# Patient Record
Sex: Male | Born: 1985 | Race: Black or African American | Hispanic: No | Smoking: Never smoker
Health system: Southern US, Community
[De-identification: ages and names within clinical notes are randomized; demographics above are authoritative.]

## PROBLEM LIST (undated history)

## (undated) DIAGNOSIS — I10 Essential (primary) hypertension: Secondary | ICD-10-CM

---
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
## 2004-08-25 ENCOUNTER — Emergency Department: Payer: Self-pay | Admitting: General Practice

## 2007-04-06 ENCOUNTER — Emergency Department (HOSPITAL_COMMUNITY): Admission: EM | Admit: 2007-04-06 | Discharge: 2007-04-06 | Payer: Self-pay | Admitting: Emergency Medicine

## 2007-07-13 ENCOUNTER — Emergency Department (HOSPITAL_COMMUNITY): Admission: EM | Admit: 2007-07-13 | Discharge: 2007-07-13 | Payer: Self-pay | Admitting: Emergency Medicine

## 2007-07-17 ENCOUNTER — Emergency Department (HOSPITAL_COMMUNITY): Admission: EM | Admit: 2007-07-17 | Discharge: 2007-07-17 | Payer: Self-pay | Admitting: Emergency Medicine

## 2007-08-14 ENCOUNTER — Emergency Department (HOSPITAL_COMMUNITY): Admission: EM | Admit: 2007-08-14 | Discharge: 2007-08-14 | Payer: Self-pay | Admitting: Family Medicine

## 2007-11-09 ENCOUNTER — Emergency Department (HOSPITAL_COMMUNITY): Admission: EM | Admit: 2007-11-09 | Discharge: 2007-11-09 | Payer: Self-pay | Admitting: Family Medicine

## 2008-06-06 ENCOUNTER — Emergency Department (HOSPITAL_COMMUNITY): Admission: EM | Admit: 2008-06-06 | Discharge: 2008-06-06 | Payer: Self-pay | Admitting: Family Medicine

## 2008-09-27 ENCOUNTER — Emergency Department (HOSPITAL_COMMUNITY): Admission: EM | Admit: 2008-09-27 | Discharge: 2008-09-27 | Payer: Self-pay | Admitting: Emergency Medicine

## 2008-12-25 ENCOUNTER — Emergency Department (HOSPITAL_COMMUNITY): Admission: EM | Admit: 2008-12-25 | Discharge: 2008-12-25 | Payer: Self-pay | Admitting: Family Medicine

## 2009-01-28 ENCOUNTER — Emergency Department (HOSPITAL_COMMUNITY): Admission: EM | Admit: 2009-01-28 | Discharge: 2009-01-28 | Payer: Self-pay | Admitting: Emergency Medicine

## 2009-12-23 ENCOUNTER — Inpatient Hospital Stay (HOSPITAL_COMMUNITY)
Admission: AD | Admit: 2009-12-23 | Discharge: 2009-12-27 | Payer: Self-pay | Source: Home / Self Care | Admitting: Psychiatry

## 2009-12-23 ENCOUNTER — Ambulatory Visit: Payer: Self-pay | Admitting: Psychiatry

## 2009-12-23 ENCOUNTER — Emergency Department (HOSPITAL_COMMUNITY)
Admission: EM | Admit: 2009-12-23 | Discharge: 2009-12-23 | Disposition: A | Discharge status: Other Acute Inpatient Hospital | Payer: Self-pay | Source: Home / Self Care | Admitting: Emergency Medicine

## 2010-04-08 LAB — CBC
HCT: 47.5 % (ref 39.0–52.0)
Hemoglobin: 16.6 g/dL (ref 13.0–17.0)
MCH: 30 pg (ref 26.0–34.0)
MCHC: 34.9 g/dL (ref 30.0–36.0)
MCV: 85.9 fL (ref 78.0–100.0)
RBC: 5.53 MIL/uL (ref 4.22–5.81)

## 2010-04-08 LAB — BASIC METABOLIC PANEL
CO2: 28 mEq/L (ref 19–32)
Chloride: 104 mEq/L (ref 96–112)
GFR calc Af Amer: >60 mL/min (ref >60)
Glucose, Bld: 96 mg/dL (ref 70–99)
Potassium: 3.7 mEq/L (ref 3.5–5.1)
Sodium: 138 mEq/L (ref 135–145)

## 2010-04-08 LAB — DIFFERENTIAL
Basophils Relative: 1 % (ref 0–1)
Eosinophils Absolute: 1.5 10*3/uL — ABNORMAL HIGH (ref 0.0–0.7)
Lymphs Abs: 1.7 10*3/uL (ref 0.7–4.0)
Monocytes Absolute: 0.7 10*3/uL (ref 0.1–1.0)
Monocytes Relative: 9 % (ref 3–12)
Neutrophils Relative %: 49 % (ref 43–77)

## 2010-04-08 LAB — RAPID URINE DRUG SCREEN, HOSP PERFORMED
Amphetamines: NOT DETECTED
Cocaine: NOT DETECTED
Opiates: NOT DETECTED
Tetrahydrocannabinol: NOT DETECTED

## 2010-04-09 LAB — HEPATIC FUNCTION PANEL
ALT: 12 U/L (ref 0–53)
AST: 16 U/L (ref 0–37)
Bilirubin, Direct: 0.2 mg/dL (ref 0.0–0.3)
Indirect Bilirubin: 1.9 mg/dL — ABNORMAL HIGH (ref 0.3–0.9)
Total Protein: 7 g/dL (ref 6.0–8.3)

## 2010-04-30 LAB — GC/CHLAMYDIA PROBE AMP, GENITAL: Chlamydia, DNA Probe: POSITIVE — AB

## 2010-05-06 LAB — GC/CHLAMYDIA PROBE AMP, GENITAL
Chlamydia, DNA Probe: NEGATIVE
GC Probe Amp, Genital: NEGATIVE

## 2010-10-24 LAB — POCT URINALYSIS DIP (DEVICE)
Bilirubin Urine: NEGATIVE
Ketones, ur: NEGATIVE
Operator id: 282151
Specific Gravity, Urine: 1.02

## 2010-10-24 LAB — GC/CHLAMYDIA PROBE AMP, GENITAL: Chlamydia, DNA Probe: NEGATIVE

## 2010-10-27 LAB — GC/CHLAMYDIA PROBE AMP, GENITAL
Chlamydia, DNA Probe: NEGATIVE
GC Probe Amp, Genital: POSITIVE — AB

## 2010-10-27 LAB — RPR: RPR Ser Ql: NONREACTIVE

## 2010-10-27 LAB — HIV ANTIBODY (ROUTINE TESTING W REFLEX): HIV: NONREACTIVE

## 2012-06-22 ENCOUNTER — Emergency Department (INDEPENDENT_AMBULATORY_CARE_PROVIDER_SITE_OTHER)
Admission: EM | Admit: 2012-06-22 | Discharge: 2012-06-22 | Disposition: A | Discharge status: Home / Self Care | Payer: Self-pay | Source: Home / Self Care

## 2012-06-22 ENCOUNTER — Encounter (HOSPITAL_COMMUNITY): Payer: Self-pay | Admitting: Emergency Medicine

## 2012-06-22 ENCOUNTER — Other Ambulatory Visit (HOSPITAL_COMMUNITY)
Admission: RE | Admit: 2012-06-22 | Discharge: 2012-06-22 | Disposition: A | Discharge status: Home / Self Care | Payer: Self-pay | Source: Ambulatory Visit | Attending: Family Medicine | Admitting: Family Medicine

## 2012-06-22 DIAGNOSIS — Z711 Person with feared health complaint in whom no diagnosis is made: Secondary | ICD-10-CM

## 2012-06-22 DIAGNOSIS — R369 Urethral discharge, unspecified: Secondary | ICD-10-CM

## 2012-06-22 DIAGNOSIS — Z113 Encounter for screening for infections with a predominantly sexual mode of transmission: Secondary | ICD-10-CM | POA: Insufficient documentation

## 2012-06-22 LAB — POCT URINALYSIS DIP (DEVICE)
Glucose, UA: NEGATIVE mg/dL
Nitrite: NEGATIVE
Protein, ur: NEGATIVE mg/dL
Urobilinogen, UA: 1 mg/dL (ref 0.0–1.0)

## 2012-06-22 MED ORDER — CEFTRIAXONE SODIUM 250 MG IJ SOLR
INTRAMUSCULAR | Status: AC
  Filled 2012-06-22: qty 250

## 2012-06-22 MED ORDER — AZITHROMYCIN 250 MG PO TABS
ORAL_TABLET | ORAL | Status: AC
  Filled 2012-06-22: qty 4

## 2012-06-22 MED ORDER — CEFTRIAXONE SODIUM 250 MG IJ SOLR
250.0000 mg | Freq: Once | INTRAMUSCULAR | Status: AC
  Administered 2012-06-22: 250 mg via INTRAMUSCULAR

## 2012-06-22 MED ORDER — LIDOCAINE HCL (PF) 1 % IJ SOLN
INTRAMUSCULAR | Status: AC
  Filled 2012-06-22: qty 5

## 2012-06-22 MED ORDER — AZITHROMYCIN 250 MG PO TABS
1000.0000 mg | ORAL_TABLET | Freq: Once | ORAL | Status: AC
  Administered 2012-06-22: 1000 mg via ORAL

## 2012-06-22 NOTE — ED Notes (Signed)
C/o penile yellowish discharge onset this AM.  No dysuria.
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
## 2012-06-24 ENCOUNTER — Telehealth (HOSPITAL_COMMUNITY): Payer: Self-pay | Admitting: *Deleted

## 2012-06-24 LAB — URINE CULTURE: Culture: NO GROWTH

## 2012-06-24 NOTE — ED Notes (Signed)
GC/Chlamydia pos., Urine culture: No growth.  Pt. Adequately treated with Rocephin and Zithromax. I called pt. And mailbox was full. Unable to leave a message. Vassie Moselle 06/24/2012

## 2012-06-25 NOTE — ED Provider Notes (Signed)
Medical screening examination/treatment/procedure(s) were performed by resident physician or non-physician practitioner and as supervising physician I was immediately available for consultation/collaboration.   Gracianna Vink DOUGLAS MD.   Avielle Imbert D Muranda Coye, MD 06/25/12 1100 

## 2012-06-28 NOTE — ED Notes (Signed)
Chart review.

## 2012-06-28 NOTE — ED Notes (Signed)
Unable to contact by phone, letter sent, asking them to call to discuss visit

## 2012-07-07 ENCOUNTER — Telehealth (HOSPITAL_COMMUNITY): Payer: Self-pay | Admitting: *Deleted

## 2012-12-16 ENCOUNTER — Encounter (HOSPITAL_COMMUNITY): Payer: Self-pay | Admitting: Emergency Medicine

## 2012-12-16 ENCOUNTER — Emergency Department (HOSPITAL_COMMUNITY)
Admission: EM | Admit: 2012-12-16 | Discharge: 2012-12-16 | Disposition: A | Discharge status: Home / Self Care | Payer: Self-pay | Attending: Emergency Medicine | Admitting: Emergency Medicine

## 2012-12-16 DIAGNOSIS — S0993XA Unspecified injury of face, initial encounter: Secondary | ICD-10-CM

## 2012-12-16 DIAGNOSIS — S01511A Laceration without foreign body of lip, initial encounter: Secondary | ICD-10-CM

## 2012-12-16 DIAGNOSIS — S01501A Unspecified open wound of lip, initial encounter: Secondary | ICD-10-CM | POA: Insufficient documentation

## 2012-12-16 DIAGNOSIS — S025XXA Fracture of tooth (traumatic), initial encounter for closed fracture: Secondary | ICD-10-CM | POA: Insufficient documentation

## 2012-12-16 MED ORDER — TRAMADOL HCL 50 MG PO TABS
50.0000 mg | ORAL_TABLET | Freq: Once | ORAL | Status: AC
  Administered 2012-12-16: 50 mg via ORAL
  Filled 2012-12-16: qty 1

## 2012-12-16 MED ORDER — TRAMADOL HCL 50 MG PO TABS: 50.0000 mg | ORAL_TABLET | Freq: Four times a day (QID) | ORAL | Status: DC | PRN
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
## 2012-12-16 NOTE — ED Provider Notes (Signed)
Medical screening examination/treatment/procedure(s) were performed by non-physician practitioner and as supervising physician I was immediately available for consultation/collaboration.   Ean Gettel, MD 12/16/12 0606 
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
## 2012-12-18 ENCOUNTER — Encounter (HOSPITAL_COMMUNITY): Payer: Self-pay | Admitting: Emergency Medicine

## 2012-12-18 ENCOUNTER — Inpatient Hospital Stay (HOSPITAL_COMMUNITY)
Admission: EM | Admit: 2012-12-18 | Discharge: 2012-12-20 | DRG: 603 | Disposition: A | Discharge status: Home / Self Care | Payer: Self-pay | Attending: Internal Medicine | Admitting: Internal Medicine

## 2012-12-18 ENCOUNTER — Emergency Department (HOSPITAL_COMMUNITY): Payer: Self-pay

## 2012-12-18 DIAGNOSIS — L0201 Cutaneous abscess of face: Principal | ICD-10-CM | POA: Diagnosis present

## 2012-12-18 DIAGNOSIS — IMO0001 Reserved for inherently not codable concepts without codable children: Secondary | ICD-10-CM

## 2012-12-18 DIAGNOSIS — S02401A Maxillary fracture, unspecified, initial encounter for closed fracture: Secondary | ICD-10-CM

## 2012-12-18 DIAGNOSIS — L03211 Cellulitis of face: Principal | ICD-10-CM

## 2012-12-18 DIAGNOSIS — S02400A Malar fracture unspecified, initial encounter for closed fracture: Secondary | ICD-10-CM

## 2012-12-18 DIAGNOSIS — K0889 Other specified disorders of teeth and supporting structures: Secondary | ICD-10-CM | POA: Diagnosis present

## 2012-12-18 DIAGNOSIS — S02401D Maxillary fracture, unspecified, subsequent encounter for fracture with routine healing: Secondary | ICD-10-CM

## 2012-12-18 LAB — CBC WITH DIFFERENTIAL/PLATELET
Basophils Absolute: 0 10*3/uL (ref 0.0–0.1)
Basophils Relative: 0 % (ref 0–1)
Eosinophils Absolute: 0 10*3/uL (ref 0.0–0.7)
Eosinophils Relative: 1 % (ref 0–5)
HCT: 47.3 % (ref 39.0–52.0)
Hemoglobin: 16.7 g/dL (ref 13.0–17.0)
Lymphocytes Relative: 10 % — ABNORMAL LOW (ref 12–46)
Lymphs Abs: 0.7 10*3/uL (ref 0.7–4.0)
MCH: 29.7 pg (ref 26.0–34.0)
MCHC: 35.3 g/dL (ref 30.0–36.0)
MCV: 84 fL (ref 78.0–100.0)
Monocytes Absolute: 0.6 10*3/uL (ref 0.1–1.0)
Monocytes Relative: 9 % (ref 3–12)
Neutro Abs: 5.3 10*3/uL (ref 1.7–7.7)
Neutrophils Relative %: 80 % — ABNORMAL HIGH (ref 43–77)
Platelets: 161 10*3/uL (ref 150–400)
RBC: 5.63 MIL/uL (ref 4.22–5.81)
RDW: 12.9 % (ref 11.5–15.5)
WBC: 6.6 10*3/uL (ref 4.0–10.5)

## 2012-12-18 LAB — BASIC METABOLIC PANEL
BUN: 7 mg/dL (ref 6–23)
CO2: 26 mEq/L (ref 19–32)
Calcium: 9.1 mg/dL (ref 8.4–10.5)
Chloride: 101 mEq/L (ref 96–112)
Creatinine, Ser: 0.84 mg/dL (ref 0.50–1.35)
GFR calc Af Amer: >90 mL/min (ref >90)
GFR calc non Af Amer: >90 mL/min (ref >90)
Glucose, Bld: 100 mg/dL — ABNORMAL HIGH (ref 70–99)
Potassium: 3.6 mEq/L (ref 3.5–5.1)
Sodium: 138 mEq/L (ref 135–145)

## 2012-12-18 LAB — POCT I-STAT CREATININE: Creatinine, Ser: 1.1 mg/dL (ref 0.50–1.35)

## 2012-12-18 MED ORDER — ONDANSETRON 4 MG PO TBDP: 4.0000 mg | ORAL_TABLET | Freq: Once | ORAL | Status: DC

## 2012-12-18 MED ORDER — HYDROMORPHONE HCL PF 1 MG/ML IJ SOLN
1.0000 mg | Freq: Once | INTRAMUSCULAR | Status: AC
  Administered 2012-12-18: 1 mg via INTRAVENOUS
  Filled 2012-12-18: qty 1

## 2012-12-18 MED ORDER — ONDANSETRON HCL 4 MG/2ML IJ SOLN
4.0000 mg | Freq: Once | INTRAMUSCULAR | Status: AC
  Administered 2012-12-18: 4 mg via INTRAVENOUS
  Filled 2012-12-18: qty 2

## 2012-12-18 MED ORDER — ACETAMINOPHEN 325 MG PO TABS: 650.0000 mg | ORAL_TABLET | Freq: Four times a day (QID) | ORAL | Status: DC | PRN

## 2012-12-18 MED ORDER — CLINDAMYCIN PHOSPHATE 600 MG/50ML IV SOLN
600.0000 mg | Freq: Once | INTRAVENOUS | Status: AC
  Administered 2012-12-18: 600 mg via INTRAVENOUS
  Filled 2012-12-18: qty 50

## 2012-12-18 MED ORDER — IOHEXOL 300 MG/ML  SOLN
80.0000 mL | Freq: Once | INTRAMUSCULAR | Status: AC | PRN
  Administered 2012-12-18: 80 mL via INTRAVENOUS

## 2012-12-18 MED ORDER — VANCOMYCIN HCL IN DEXTROSE 1-5 GM/200ML-% IV SOLN
1000.0000 mg | Freq: Three times a day (TID) | INTRAVENOUS | Status: DC
  Administered 2012-12-18 – 2012-12-20 (×6): 1000 mg via INTRAVENOUS
  Filled 2012-12-18 (×10): qty 200

## 2012-12-18 MED ORDER — SODIUM CHLORIDE 0.9 % IV SOLN
INTRAVENOUS | Status: DC
  Administered 2012-12-19: via INTRAVENOUS

## 2012-12-18 MED ORDER — AMPICILLIN-SULBACTAM SODIUM 3 (2-1) G IJ SOLR
3.0000 g | Freq: Four times a day (QID) | INTRAMUSCULAR | Status: DC
  Administered 2012-12-18 – 2012-12-20 (×8): 3 g via INTRAVENOUS
  Filled 2012-12-18 (×12): qty 3

## 2012-12-18 MED ORDER — HYDROMORPHONE HCL PF 1 MG/ML IJ SOLN
1.0000 mg | INTRAMUSCULAR | Status: DC | PRN
  Administered 2012-12-19 – 2012-12-20 (×4): 1 mg via INTRAVENOUS
  Filled 2012-12-18 (×4): qty 1

## 2012-12-18 MED ORDER — SODIUM CHLORIDE 0.9 % IV BOLUS (SEPSIS)
1000.0000 mL | Freq: Once | INTRAVENOUS | Status: AC
  Administered 2012-12-18: 1000 mL via INTRAVENOUS

## 2012-12-18 MED ORDER — ONDANSETRON HCL 4 MG/2ML IJ SOLN
4.0000 mg | Freq: Four times a day (QID) | INTRAMUSCULAR | Status: DC | PRN
  Administered 2012-12-19 – 2012-12-20 (×3): 4 mg via INTRAVENOUS
  Filled 2012-12-18 (×3): qty 2

## 2012-12-18 MED ORDER — KETOROLAC TROMETHAMINE 30 MG/ML IJ SOLN
30.0000 mg | Freq: Four times a day (QID) | INTRAMUSCULAR | Status: AC | PRN
  Administered 2012-12-18 – 2012-12-19 (×2): 30 mg via INTRAVENOUS
  Filled 2012-12-18 (×3): qty 1
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
## 2012-12-18 NOTE — ED Notes (Signed)
Joni Reining, PA back at the bedside with patient.
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
## 2012-12-18 NOTE — ED Provider Notes (Signed)
Medical screening examination/treatment/procedure(s) were performed by non-physician practitioner and as supervising physician I was immediately available for consultation/collaboration.  EKG Interpretation    Date/Time:    Ventricular Rate:    PR Interval:    QRS Duration:   QT Interval:    QTC Calculation:   R Axis:     Text Interpretation:               Shon Baton, MD 12/18/12 2038

## 2012-12-18 NOTE — ED Notes (Signed)
Per pt was seen at [facility] yesterday and had a laceration sutured and now whole right side of face is swollen and very painful

## 2012-12-18 NOTE — ED Notes (Signed)
Nicole, PA at the bedside.  

## 2012-12-19 LAB — BASIC METABOLIC PANEL
CO2: 27 mEq/L (ref 19–32)
Calcium: 8.7 mg/dL (ref 8.4–10.5)
Creatinine, Ser: 1.02 mg/dL (ref 0.50–1.35)
GFR calc Af Amer: >90 mL/min (ref >90)
GFR calc non Af Amer: >90 mL/min (ref >90)

## 2012-12-19 LAB — CBC
HCT: 42.1 % (ref 39.0–52.0)
MCH: 28.6 pg (ref 26.0–34.0)
MCHC: 33.7 g/dL (ref 30.0–36.0)
MCV: 84.7 fL (ref 78.0–100.0)
Platelets: 162 10*3/uL (ref 150–400)
RBC: 4.97 MIL/uL (ref 4.22–5.81)
RDW: 12.9 % (ref 11.5–15.5)

## 2012-12-19 MED ORDER — KETOROLAC TROMETHAMINE 30 MG/ML IJ SOLN
30.0000 mg | Freq: Four times a day (QID) | INTRAMUSCULAR | Status: DC | PRN
  Administered 2012-12-19 – 2012-12-20 (×3): 30 mg via INTRAVENOUS
  Filled 2012-12-19 (×2): qty 1

## 2012-12-19 NOTE — Progress Notes (Signed)
12/19/12 Spoke with patient about PCP, he agreed to go to [facility]. Made appt for 01/04/13 at 12pm. Gave patient appt time and a pharmacy discount card. Will continue to follow for d/c needs. Jacquelynn Cree RN, BSN, CCM
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
## 2012-12-20 DIAGNOSIS — IMO0001 Reserved for inherently not codable concepts without codable children: Secondary | ICD-10-CM

## 2012-12-20 MED ORDER — BACITRACIN 500 UNIT/GM EX OINT: 1.0000 "application " | TOPICAL_OINTMENT | Freq: Two times a day (BID) | CUTANEOUS | Status: DC

## 2012-12-20 MED ORDER — OXYCODONE-ACETAMINOPHEN 5-325 MG PO TABS: 1.0000 | ORAL_TABLET | ORAL | Status: DC | PRN

## 2012-12-20 MED ORDER — AMOXICILLIN-POT CLAVULANATE 875-125 MG PO TABS: 1.0000 | ORAL_TABLET | Freq: Two times a day (BID) | ORAL | Status: DC

## 2012-12-20 NOTE — Progress Notes (Signed)
Dr Pollyann Kennedy called re: patient states a stitch came out while getting dressed in room. Dabbing blood from inside upper lip wound. Gauze to bleeding. No further orders. Reminded patient he needs to see dentis=st ASA[facility] per Dr Pollyann Kennedy. D/C in stable condition and bleeding then stopped.
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
## 2013-01-04 ENCOUNTER — Inpatient Hospital Stay: Payer: Self-pay

## 2013-01-04 ENCOUNTER — Telehealth: Payer: Self-pay | Admitting: Internal Medicine

## 2013-01-04 NOTE — Telephone Encounter (Signed)
Pt is calling to ask if she needs to come in to get her stiches removed; pt did not make it to the appt because she had a prior appt. With the neurologist;

## 2013-02-13 ENCOUNTER — Other Ambulatory Visit (HOSPITAL_COMMUNITY)
Admission: RE | Admit: 2013-02-13 | Discharge: 2013-02-13 | Disposition: A | Discharge status: Home / Self Care | Payer: Self-pay | Source: Ambulatory Visit | Attending: Family Medicine | Admitting: Family Medicine

## 2013-02-13 ENCOUNTER — Encounter (HOSPITAL_COMMUNITY): Payer: Self-pay | Admitting: Emergency Medicine

## 2013-02-13 ENCOUNTER — Emergency Department (INDEPENDENT_AMBULATORY_CARE_PROVIDER_SITE_OTHER)
Admission: EM | Admit: 2013-02-13 | Discharge: 2013-02-13 | Disposition: A | Discharge status: Home / Self Care | Payer: Self-pay | Source: Home / Self Care | Attending: Family Medicine | Admitting: Family Medicine

## 2013-02-13 DIAGNOSIS — Z113 Encounter for screening for infections with a predominantly sexual mode of transmission: Secondary | ICD-10-CM | POA: Insufficient documentation

## 2013-02-13 DIAGNOSIS — Z202 Contact with and (suspected) exposure to infections with a predominantly sexual mode of transmission: Secondary | ICD-10-CM

## 2013-02-13 MED ORDER — CEFTRIAXONE SODIUM 250 MG IJ SOLR
250.0000 mg | Freq: Once | INTRAMUSCULAR | Status: AC
  Administered 2013-02-13: 250 mg via INTRAMUSCULAR

## 2013-02-13 MED ORDER — AZITHROMYCIN 250 MG PO TABS
ORAL_TABLET | ORAL | Status: AC
  Filled 2013-02-13: qty 4

## 2013-02-13 MED ORDER — AZITHROMYCIN 250 MG PO TABS
1000.0000 mg | ORAL_TABLET | Freq: Once | ORAL | Status: AC
  Administered 2013-02-13: 1000 mg via ORAL

## 2013-02-13 MED ORDER — CEFTRIAXONE SODIUM 250 MG IJ SOLR
INTRAMUSCULAR | Status: AC
  Filled 2013-02-13: qty 250

## 2013-02-13 MED ORDER — LIDOCAINE HCL (PF) 1 % IJ SOLN
INTRAMUSCULAR | Status: AC
  Filled 2013-02-13: qty 5

## 2013-02-13 NOTE — Discharge Instructions (Signed)
Sexually Transmitted Disease A sexually transmitted disease (STD) is a disease or infection. It may be passed from person to person. It usually is passed during sex. STDs can be spread by different types of germs. These germs are bacteria, viruses, and parasites. An STD can be passed through:  Spit (saliva).  Semen.  Blood.  Mucus from the vagina.  Pee (urine). HOW CAN I LESSEN MY CHANCES OF GETTING AN STD?  Only use condoms labeled "latex," dental dams, and lubricants that wash away with water (water soluble). Do not use petroleum jelly or oils.  Get shots (vaccines) for HPV and hepatitis.  Avoid risky sex behavior that can break the skin. WHAT SHOULD I DO IF I THINK I HAVE AN STD?  See your doctor.  Tell your sex partner(s) that you have an STD. They should be tested and treated.  Do not have sex until your doctor says it is OK. WHEN SHOULD I GET HELP? Get help if:  You have bad belly (abdominal) pain.  You are a man and have puffiness (swelling) or pain in your testicles.  You are a woman and have puffiness in your vagina. MAKE SURE YOU:  Understand these instructions. Document Released: 02/20/2004 Document Revised: 11/02/2012 Document Reviewed: 07/08/2012 ExitCare Patient Information 2014 [employer].  

## 2013-02-13 NOTE — ED Provider Notes (Signed)
CSN: [id number]     Arrival date & time 02/13/13  1148 History   First MD Initiated Contact with Patient 02/13/13 1329     Chief Complaint  Patient presents with  . Exposure to STD   (Consider location/radiation/quality/duration/timing/severity/associated sxs/prior Treatment) HPI Comments: Partner reported to pt she was positive for chlamydia. Requests testing and treatment  Patient is a 28 y.o. male presenting with STD exposure. The history is provided by the patient.  Exposure to STD This is a new problem. Episode onset: unknown. The problem has not changed since onset.Pertinent negatives include no abdominal pain. Nothing aggravates the symptoms. Nothing relieves the symptoms. He has tried nothing for the symptoms.    History reviewed. No pertinent past medical history. History reviewed. No pertinent past surgical history. History reviewed. No pertinent family history. History  Substance Use Topics  . Smoking status: Never Smoker   . Smokeless tobacco: Not on file  . Alcohol Use: 1.2 oz/week    2 Cans of beer per week    Review of Systems  Constitutional: Negative for fever and chills.  Gastrointestinal: Negative for abdominal pain.  Genitourinary: Negative for dysuria, discharge, genital sores, penile pain and testicular pain.    Allergies  Review of patient's allergies indicates no known allergies.  Home Medications   Current Outpatient Rx  Name  Route  Sig  Dispense  Refill  . amoxicillin-clavulanate (AUGMENTIN) 875-125 MG per tablet   Oral   Take 1 tablet by mouth 2 (two) times daily.   20 tablet   0   . bacitracin 500 UNIT/GM ointment   Topical   Apply 1 application topically 2 (two) times daily.   15 g   2   . oxyCODONE-acetaminophen (ROXICET) 5-325 MG per tablet   Oral   Take 1 tablet by mouth every 4 (four) hours as needed for severe pain.   30 tablet   0    BP 150/90  Pulse 80  Temp(Src) 98.6 F (37 C) (Oral)  Resp 16  SpO2 100% Physical  Exam  Constitutional: He appears well-developed and well-nourished. No distress.  Pulmonary/Chest: Effort normal.  Genitourinary:  deferred    ED Course  Procedures (including critical care time) Labs Review Labs Reviewed  URINE CYTOLOGY ANCILLARY ONLY   Imaging Review No results found.  EKG Interpretation    Date/Time:    Ventricular Rate:    PR Interval:    QRS Duration:   QT Interval:    QTC Calculation:   R Axis:     Text Interpretation:              MDM   1. Exposure to STD   given rocephin 250mg  IM and chlamydia 1000mg  [facility].      Cathlyn ParsonsAngela M Eadie Repetto, NP 02/13/13 [phone]

## 2013-02-13 NOTE — ED Notes (Signed)
Reports being told today by partner that she tested positive for ct.   Pt denies having any symptoms at this time.  Here for testing.

## 2013-02-14 NOTE — ED Provider Notes (Signed)
Medical screening examination/treatment/procedure(s) were performed by resident physician or non-physician practitioner and as supervising physician I was immediately available for consultation/collaboration.   Ladonya Jerkins DOUGLAS MD.   Broderick Fonseca D Saifullah Jolley, MD 02/14/13 1407 

## 2013-02-15 ENCOUNTER — Telehealth (HOSPITAL_COMMUNITY): Payer: Self-pay | Admitting: *Deleted
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
## 2013-06-26 ENCOUNTER — Encounter (HOSPITAL_COMMUNITY): Payer: Self-pay | Admitting: Emergency Medicine

## 2013-06-26 ENCOUNTER — Emergency Department (HOSPITAL_COMMUNITY)
Admission: EM | Admit: 2013-06-26 | Discharge: 2013-06-26 | Disposition: A | Discharge status: Home / Self Care | Payer: Self-pay | Attending: Emergency Medicine | Admitting: Emergency Medicine

## 2013-06-26 ENCOUNTER — Emergency Department (HOSPITAL_COMMUNITY): Payer: Self-pay

## 2013-06-26 DIAGNOSIS — S339XXA Sprain of unspecified parts of lumbar spine and pelvis, initial encounter: Secondary | ICD-10-CM | POA: Insufficient documentation

## 2013-06-26 DIAGNOSIS — Y998 Other external cause status: Secondary | ICD-10-CM | POA: Insufficient documentation

## 2013-06-26 DIAGNOSIS — S335XXA Sprain of ligaments of lumbar spine, initial encounter: Principal | ICD-10-CM

## 2013-06-26 DIAGNOSIS — S39012A Strain of muscle, fascia and tendon of lower back, initial encounter: Secondary | ICD-10-CM

## 2013-06-26 DIAGNOSIS — Y9389 Activity, other specified: Secondary | ICD-10-CM | POA: Insufficient documentation

## 2013-06-26 DIAGNOSIS — Y929 Unspecified place or not applicable: Secondary | ICD-10-CM | POA: Insufficient documentation

## 2013-06-26 DIAGNOSIS — W208XXA Other cause of strike by thrown, projected or falling object, initial encounter: Secondary | ICD-10-CM | POA: Insufficient documentation

## 2013-06-26 DIAGNOSIS — I10 Essential (primary) hypertension: Secondary | ICD-10-CM | POA: Insufficient documentation

## 2013-06-26 HISTORY — DX: Essential (primary) hypertension: I10

## 2013-06-26 MED ORDER — KETOROLAC TROMETHAMINE 60 MG/2ML IM SOLN
60.0000 mg | Freq: Once | INTRAMUSCULAR | Status: AC
  Administered 2013-06-26: 60 mg via INTRAMUSCULAR
  Filled 2013-06-26: qty 2

## 2013-06-26 MED ORDER — DIAZEPAM 5 MG PO TABS
5.0000 mg | ORAL_TABLET | Freq: Once | ORAL | Status: AC
  Administered 2013-06-26: 5 mg via ORAL
  Filled 2013-06-26: qty 1

## 2013-06-26 MED ORDER — DIAZEPAM 5 MG PO TABS: 5.0000 mg | ORAL_TABLET | Freq: Four times a day (QID) | ORAL | Status: DC | PRN

## 2013-06-26 MED ORDER — IBUPROFEN 600 MG PO TABS: 600.0000 mg | ORAL_TABLET | Freq: Four times a day (QID) | ORAL | Status: DC | PRN

## 2013-06-26 NOTE — Discharge Instructions (Signed)
Lumbosacral Strain Lumbosacral strain is a strain of any of the parts that make up your lumbosacral vertebrae. Your lumbosacral vertebrae are the bones that make up the lower third of your backbone. Your lumbosacral vertebrae are held together by muscles and tough, fibrous tissue (ligaments).  CAUSES  A sudden blow to your back can cause lumbosacral strain. Also, anything that causes an excessive stretch of the muscles in the low back can cause this strain. This is typically seen when people exert themselves strenuously, fall, lift heavy objects, bend, or crouch repeatedly. RISK FACTORS  Physically demanding work.  Participation in pushing or pulling sports or sports that require sudden twist of the back (tennis, golf, baseball).  Weight lifting.  Excessive lower back curvature.  Forward-tilted pelvis.  Weak back or abdominal muscles or both.  Tight hamstrings. SIGNS AND SYMPTOMS  Lumbosacral strain may cause pain in the area of your injury or pain that moves (radiates) down your leg.  DIAGNOSIS Your health care provider can often diagnose lumbosacral strain through a physical exam. In some cases, you may need tests such as X-ray exams.  TREATMENT  Treatment for your lower back injury depends on many factors that your clinician will have to evaluate. However, most treatment will include the use of anti-inflammatory medicines. HOME CARE INSTRUCTIONS   Avoid hard physical activities (tennis, racquetball, waterskiing) if you are not in proper physical condition for it. This may aggravate or create problems.  If you have a back problem, avoid sports requiring sudden body movements. Swimming and walking are generally safer activities.  Maintain good posture.  Maintain a healthy weight.  For acute conditions, you may put ice on the injured area.  Put ice in a plastic bag.  Place a towel between your skin and the bag.  Leave the ice on for 20 minutes, 2 3 times a day.  When the  low back starts healing, stretching and strengthening exercises may be recommended. SEEK MEDICAL CARE IF:  Your back pain is getting worse.  You experience severe back pain not relieved with medicines. SEEK IMMEDIATE MEDICAL CARE IF:   You have numbness, tingling, weakness, or problems with the use of your arms or legs.  There is a change in bowel or bladder control.  You have increasing pain in any area of the body, including your belly (abdomen).  You notice shortness of breath, dizziness, or feel faint.  You feel sick to your stomach (nauseous), are throwing up (vomiting), or become sweaty.  You notice discoloration of your toes or legs, or your feet get very cold. MAKE SURE YOU:   Understand these instructions.  Will watch your condition.  Will get help right away if you are not doing well or get worse. Document Released: 10/22/2004 Document Revised: 11/02/2012 Document Reviewed: 08/31/2012 ExitCare Patient Information 2014 [employer].  

## 2013-06-26 NOTE — ED Provider Notes (Signed)
CSN: [id number]     Arrival date & time 06/26/13  [id number] History   First MD Initiated Contact with Patient 06/26/13 [phone]     Chief Complaint  Patient presents with  . Back Pain     (Consider location/radiation/quality/duration/timing/severity/associated sxs/prior Treatment) HPI  This is a 28 year old male with history of hypertension who presents with back pain. Patient reports that on Friday he was fixing a slat under the bed when the box brings fell on his back.  He reports pain to the left back. It has been progressively worse since Friday. He denies any difficulty ambulating,. The pain radiates down his left leg. Currently pain is 8/10. It does not get better with Advil.  He denies any difficulty peeing or having bowel movements, saddle anesthesia, or recent fevers. He has not noted any hematuria.  Past Medical History  Diagnosis Date  . Hypertension    History reviewed. No pertinent past surgical history. No family history on file. History  Substance Use Topics  . Smoking status: Never Smoker   . Smokeless tobacco: Not on file  . Alcohol Use: 1.2 oz/week    2 Cans of beer per week    Review of Systems  Constitutional: Negative.  Negative for fever.  Respiratory: Negative.  Negative for shortness of breath.   Cardiovascular: Negative.  Negative for chest pain.  Gastrointestinal: Negative.  Negative for abdominal pain.  Genitourinary: Negative.  Negative for hematuria.  Musculoskeletal: Positive for back pain.  Skin: Negative for wound.  Neurological: Negative for weakness, numbness and headaches.  All other systems reviewed and are negative.     Allergies  Review of patient's allergies indicates no known allergies.  Home Medications   Prior to Admission medications   Medication Sig Start Date End Date Taking? Authorizing Provider  diazepam (VALIUM) 5 MG tablet Take 1 tablet (5 mg total) by mouth every 6 (six) hours as needed for muscle spasms. 06/26/13   Shon Batonourtney F  Mykhia Danish, MD  ibuprofen (ADVIL,MOTRIN) 600 MG tablet Take 1 tablet (600 mg total) by mouth every 6 (six) hours as needed. 06/26/13   Shon Batonourtney F Rhandi Despain, MD   BP 119/76  Pulse 70  Temp(Src) 98.1 F (36.7 C) (Oral)  Resp 18  SpO2 100% Physical Exam  Nursing note and vitals reviewed. Constitutional: He is oriented to person, place, and time. He appears well-developed and well-nourished. No distress.  HENT:  Head: Normocephalic and atraumatic.  Cardiovascular: Normal rate, regular rhythm and normal heart sounds.   No murmur heard. Pulmonary/Chest: Effort normal and breath sounds normal. No respiratory distress. He has no wheezes.  Abdominal: Soft. There is no tenderness.  Musculoskeletal: He exhibits no edema.  No midline lumbar thoracic spine tenderness, patient does have tenderness palpation over the left lower paraspinous muscle region with associated spasm  Lymphadenopathy:    He has no cervical adenopathy.  Neurological: He is alert and oriented to person, place, and time.  Normal gait, 5 out of 5 strength in all 4 extremities, no clonus noted  Skin: Skin is warm and dry.  Psychiatric: He has a normal mood and affect.    ED Course  Procedures (including critical care time) Labs Review Labs Reviewed  URINALYSIS, ROUTINE W REFLEX MICROSCOPIC    Imaging Review Dg Lumbar Spine Complete  06/26/2013   CLINICAL DATA:  Fall with left low back pain going into left leg.  EXAM: LUMBAR SPINE - COMPLETE 4+ VIEW  COMPARISON:  01/28/2009.  FINDINGS: Alignment is anatomic. Vertebral body  and disc space height are maintained. No pars defects. No degenerative changes.  IMPRESSION: Negative.   Electronically Signed   By: Leanna Battles M.D.   On: 06/26/2013 07:27     EKG Interpretation None      MDM   Final diagnoses:  Lumbosacral strain    Patient presents with right lower back pain. Injury on Friday. He is nontoxic and nonfocal. He has good sensation and strength of the lower  extremities. No other red flags back pain. Lumbar spine films are negative. Muscle spasm noted on exam.  Patient given Valium and Toradol. Likely musculoskeletal sprain. Discussed diagnoses with the patient. Will discharge with valium and ibuprofen.  After history, exam, and medical workup I feel the patient has been appropriately medically screened and is safe for discharge home. Pertinent diagnoses were discussed with the patient. Patient was given return precautions.     Shon Baton, MD 06/26/13 [phone]

## 2013-06-26 NOTE — ED Notes (Signed)
MD at bedside. 

## 2013-06-26 NOTE — ED Notes (Signed)
Patient says he was fixing the bed and placed the box spring against the wall.  He thought it would stay propped up but when he bent over to fix the bed, the box spring fell on him.

## 2013-06-26 NOTE — ED Notes (Signed)
The pt has lower back pain  A bed fell onto his back on friday

## 2013-11-15 ENCOUNTER — Encounter (HOSPITAL_COMMUNITY): Payer: Self-pay | Admitting: Emergency Medicine

## 2013-11-15 ENCOUNTER — Emergency Department (HOSPITAL_COMMUNITY)
Admission: EM | Admit: 2013-11-15 | Discharge: 2013-11-15 | Disposition: A | Discharge status: Home / Self Care | Payer: Self-pay | Attending: Emergency Medicine | Admitting: Emergency Medicine

## 2013-11-15 DIAGNOSIS — L0231 Cutaneous abscess of buttock: Secondary | ICD-10-CM | POA: Insufficient documentation

## 2013-11-15 DIAGNOSIS — I10 Essential (primary) hypertension: Secondary | ICD-10-CM | POA: Insufficient documentation

## 2013-11-15 MED ORDER — HYDROCODONE-ACETAMINOPHEN 5-325 MG PO TABS: 1.0000 | ORAL_TABLET | ORAL | Status: DC | PRN

## 2013-11-15 MED ORDER — SULFAMETHOXAZOLE-TRIMETHOPRIM 400-80 MG PO TABS
1.0000 | ORAL_TABLET | Freq: Two times a day (BID) | ORAL | Status: DC
Start: 2013-11-15 — End: 2014-05-22

## 2013-11-15 NOTE — ED Notes (Signed)
Pt complains of a boil on his buttocks for one week

## 2013-11-15 NOTE — Discharge Instructions (Signed)

## 2013-11-15 NOTE — ED Notes (Signed)
Bed: WA08 Expected date:  Expected time:  Means of arrival:  Comments: 

## 2013-11-15 NOTE — ED Notes (Signed)
Pt c/o boil at top of buttocks; no drainage; symptoms started x 2 days; previous history of same

## 2013-11-15 NOTE — ED Provider Notes (Signed)
CSN: [id number]     Arrival date & time 11/15/13  0705 History   First MD Initiated Contact with Patient 11/15/13 0754     Chief Complaint  Patient presents with  . Recurrent Skin Infections     (Consider location/radiation/quality/duration/timing/severity/associated sxs/prior Treatment) Patient is a 28 y.o. male presenting with abscess. The history is provided by the patient. No language interpreter was used.  Abscess Location:  Ano-genital Ano-genital abscess location:  Gluteal cleft Abscess quality: not draining and no fluctuance   Red streaking: no   Duration:  1 week Chronicity:  New Relieved by:  Nothing Associated symptoms: no fever and no vomiting   Associated symptoms comment:  He reports history of skin infections/boils now with similar symptoms at the right upper gluteal cleft. No drainage, fever, redness, abdominal or perirectal pain.   Past Medical History  Diagnosis Date  . Hypertension    History reviewed. No pertinent past surgical history. History reviewed. No pertinent family history. History  Substance Use Topics  . Smoking status: Never Smoker   . Smokeless tobacco: Not on file  . Alcohol Use: 1.2 oz/week    2 Cans of beer per week    Review of Systems  Constitutional: Negative for fever.  Gastrointestinal: Negative for vomiting and abdominal pain.  Musculoskeletal: Negative for myalgias.  Skin:       See HPI.      Allergies  Review of patient's allergies indicates no known allergies.  Home Medications   Prior to Admission medications   Not on File   BP 142/88  Pulse 87  Temp(Src) 98.1 F (36.7 C) (Oral)  Resp 18  Ht 5\' 6"  (1.676 m)  Wt 160 lb (72.576 kg)  BMI 25.84 kg/m2  SpO2 97% Physical Exam  Constitutional: He is oriented to person, place, and time. He appears well-developed and well-nourished.  Neck: Normal range of motion.  Pulmonary/Chest: Effort normal.  Musculoskeletal: Normal range of motion.  Neurological: He is  alert and oriented to person, place, and time.  Skin: Skin is warm and dry.  Right gluteal cleft tenderness without induration or fluctuant area. No perirectal tenderness. No redness.   Psychiatric: He has a normal mood and affect.    ED Course  Procedures (including critical care time) Labs Review Labs Reviewed - No data to display  Imaging Review No results found.   EKG Interpretation None      MDM   Final diagnoses:  None    1. Early gluteal abscess  Given patient's history of abscess and current tenderness, suspect early abscess that is not drainable at this time. Will start on abx, pain control and strongly encourage 2-day recheck here (no PCP)    Arnoldo HookerShari A Elfreida Heggs, PA-C 11/15/13 [phone]

## 2013-11-16 NOTE — ED Provider Notes (Signed)
Medical screening examination/treatment/procedure(s) were performed by non-physician practitioner and as supervising physician I was immediately available for consultation/collaboration.   EKG Interpretation None       Jayvier Burgher, MD 11/16/13 0755 

## 2013-11-18 ENCOUNTER — Emergency Department (HOSPITAL_COMMUNITY): Payer: Self-pay

## 2013-11-18 ENCOUNTER — Emergency Department (HOSPITAL_COMMUNITY)
Admission: EM | Admit: 2013-11-18 | Discharge: 2013-11-18 | Disposition: A | Discharge status: Home / Self Care | Payer: Self-pay | Attending: Emergency Medicine | Admitting: Emergency Medicine

## 2013-11-18 ENCOUNTER — Encounter (HOSPITAL_COMMUNITY): Payer: Self-pay | Admitting: Emergency Medicine

## 2013-11-18 DIAGNOSIS — Z792 Long term (current) use of antibiotics: Secondary | ICD-10-CM | POA: Insufficient documentation

## 2013-11-18 DIAGNOSIS — I10 Essential (primary) hypertension: Secondary | ICD-10-CM | POA: Insufficient documentation

## 2013-11-18 DIAGNOSIS — K611 Rectal abscess: Secondary | ICD-10-CM | POA: Insufficient documentation

## 2013-11-18 LAB — CBC WITH DIFFERENTIAL/PLATELET
BASOS PCT: 0 % (ref 0–1)
Basophils Absolute: 0 10*3/uL (ref 0.0–0.1)
EOS ABS: 0.3 10*3/uL (ref 0.0–0.7)
Eosinophils Relative: 2 % (ref 0–5)
HEMATOCRIT: 49.2 % (ref 39.0–52.0)
HEMOGLOBIN: 17.3 g/dL — AB (ref 13.0–17.0)
LYMPHS ABS: 1.5 10*3/uL (ref 0.7–4.0)
Lymphocytes Relative: 11 % — ABNORMAL LOW (ref 12–46)
MCH: 30.4 pg (ref 26.0–34.0)
MCHC: 35.2 g/dL (ref 30.0–36.0)
MCV: 86.3 fL (ref 78.0–100.0)
MONO ABS: 1.2 10*3/uL — AB (ref 0.1–1.0)
Monocytes Relative: 9 % (ref 3–12)
Neutro Abs: 10.8 10*3/uL — ABNORMAL HIGH (ref 1.7–7.7)
Neutrophils Relative %: 78 % — ABNORMAL HIGH (ref 43–77)
Platelets: 200 10*3/uL (ref 150–400)
RBC: 5.7 MIL/uL (ref 4.22–5.81)
RDW: 13.7 % (ref 11.5–15.5)
WBC: 13.8 10*3/uL — ABNORMAL HIGH (ref 4.0–10.5)

## 2013-11-18 LAB — I-STAT CHEM 8, ED
BUN: 11 mg/dL (ref 6–23)
CALCIUM ION: 1.17 mmol/L (ref 1.12–1.23)
CREATININE: 1.1 mg/dL (ref 0.50–1.35)
Chloride: 103 mEq/L (ref 96–112)
Glucose, Bld: 87 mg/dL (ref 70–99)
HEMATOCRIT: 54 % — AB (ref 39.0–52.0)
Hemoglobin: 18.4 g/dL — ABNORMAL HIGH (ref 13.0–17.0)
Potassium: 4.1 mEq/L (ref 3.7–5.3)
Sodium: 140 mEq/L (ref 137–147)
TCO2: 26 mmol/L (ref 0–100)

## 2013-11-18 MED ORDER — CLINDAMYCIN HCL 150 MG PO CAPS: 300.0000 mg | ORAL_CAPSULE | Freq: Three times a day (TID) | ORAL | Status: DC

## 2013-11-18 MED ORDER — IOHEXOL 300 MG/ML  SOLN
100.0000 mL | Freq: Once | INTRAMUSCULAR | Status: AC | PRN
  Administered 2013-11-18: 100 mL via INTRAVENOUS

## 2013-11-18 MED ORDER — FENTANYL CITRATE 0.05 MG/ML IJ SOLN
50.0000 ug | Freq: Once | INTRAMUSCULAR | Status: AC
  Administered 2013-11-18: 50 ug via INTRAVENOUS
  Filled 2013-11-18: qty 2

## 2013-11-18 MED ORDER — CLINDAMYCIN PHOSPHATE 600 MG/50ML IV SOLN
600.0000 mg | Freq: Once | INTRAVENOUS | Status: AC
  Administered 2013-11-18: 600 mg via INTRAVENOUS
  Filled 2013-11-18: qty 50

## 2013-11-18 MED ORDER — ONDANSETRON HCL 4 MG/2ML IJ SOLN
4.0000 mg | Freq: Once | INTRAMUSCULAR | Status: AC
  Administered 2013-11-18: 4 mg via INTRAVENOUS
  Filled 2013-11-18: qty 2

## 2013-11-18 MED ORDER — HYDROMORPHONE HCL 1 MG/ML IJ SOLN
1.0000 mg | Freq: Once | INTRAMUSCULAR | Status: AC
  Administered 2013-11-18: 1 mg via INTRAVENOUS
  Filled 2013-11-18: qty 1

## 2013-11-18 MED ORDER — LIDOCAINE-EPINEPHRINE (PF) 2 %-1:200000 IJ SOLN
10.0000 mL | Freq: Once | INTRAMUSCULAR | Status: AC
  Administered 2013-11-18: 10 mL via INTRADERMAL
  Filled 2013-11-18: qty 10

## 2013-11-18 MED ORDER — HYDROCODONE-ACETAMINOPHEN 5-325 MG PO TABS: 1.0000 | ORAL_TABLET | ORAL | Status: DC | PRN

## 2013-11-18 NOTE — ED Provider Notes (Signed)
Medical screening examination/treatment/procedure(s) were performed by non-physician practitioner and as supervising physician I was immediately available for consultation/collaboration.   EKG Interpretation None        Zigmund Linse, MD 11/18/13 2334 

## 2013-11-18 NOTE — ED Provider Notes (Signed)
CSN: [id number]     Arrival date & time 11/18/13  1434 History   First MD Initiated Contact with Patient 11/18/13 1551     Chief Complaint  Patient presents with  . Rectal Pain     (Consider location/radiation/quality/duration/timing/severity/associated sxs/prior Treatment) HPI Carlos Meyers is a 28 y.o. male With history of hypertension, presents to emergency department complaining of rectal pain. Patient states his pain began approximately 1 week ago. He was seen in emergency department 3 days ago, was started on antibiotics. He has been taking Bactrim since then with no improvement in his pain. States that he his pain is just adjacent to his anus. He states he has pain at rest, as well as when he is sitting down, as well as when he is having a bowel movement. He denies any fever or chills. He denies any nausea or vomiting. His states his pain is not controlled with Vicodin. He reports prior history of similar abscesses, states in the past was treated with antibiotics, and sometimes they would just "bust on their own and drain." Patient denies prior incision and drainage of these abscesses.  Past Medical History  Diagnosis Date  . Hypertension    No past surgical history on file. No family history on file. History  Substance Use Topics  . Smoking status: Never Smoker   . Smokeless tobacco: Not on file  . Alcohol Use: 1.2 oz/week    2 Cans of beer per week    Review of Systems  Constitutional: Negative for fever and chills.  Respiratory: Negative for cough, chest tightness and shortness of breath.   Cardiovascular: Negative for chest pain, palpitations and leg swelling.  Gastrointestinal: Positive for rectal pain. Negative for nausea, vomiting, abdominal pain, diarrhea and abdominal distention.  Genitourinary: Negative for dysuria, urgency, frequency and hematuria.  Musculoskeletal: Negative for arthralgias, myalgias, neck pain and neck stiffness.  Skin: Negative for rash.   Allergic/Immunologic: Negative for immunocompromised state.  Neurological: Negative for dizziness, weakness, light-headedness, numbness and headaches.  All other systems reviewed and are negative.     Allergies  Review of patient's allergies indicates no known allergies.  Home Medications   Prior to Admission medications   Medication Sig Start Date End Date Taking? Authorizing Provider  HYDROcodone-acetaminophen (NORCO/VICODIN) 5-325 MG per tablet Take 1-2 tablets by mouth every 4 (four) hours as needed. 11/15/13  Yes Shari A Upstill, PA-C  sulfamethoxazole-trimethoprim (BACTRIM) 400-80 MG per tablet Take 1 tablet by mouth 2 (two) times daily. 11/15/13  Yes Shari A Upstill, PA-C   BP 134/78  Pulse 105  Temp(Src) 98.1 F (36.7 C) (Oral)  Resp 16  SpO2 99% Physical Exam  Nursing note and vitals reviewed. Constitutional: He is oriented to person, place, and time. He appears well-developed and well-nourished. No distress.  HENT:  Head: Normocephalic and atraumatic.  Eyes: Conjunctivae are normal.  Neck: Neck supple.  Cardiovascular: Normal rate, regular rhythm and normal heart sounds.   Pulmonary/Chest: Effort normal. No respiratory distress. He has no wheezes. He has no rales.  Abdominal: Soft. Bowel sounds are normal. He exhibits no distension. There is no tenderness. There is no rebound.  Genitourinary:  Swelling over right perirectal/anal areaadjacent to the sphincter..  Area is tender to palpation.sphincters tender. Patient does not tolerate rectal exam.  Musculoskeletal: He exhibits no edema.  Neurological: He is alert and oriented to person, place, and time.  Skin: Skin is warm and dry.    ED Course  Procedures (including critical care time)  Labs Review Labs Reviewed  CBC WITH DIFFERENTIAL  I-STAT CHEM 8, ED    Imaging Review No results found.   EKG Interpretation None      INCISION AND DRAINAGE Performed by: Jaynie CrumbleKIRICHENKO, Caliope Ruppert A Consent: Verbal consent  obtained. Risks and benefits: risks, benefits and alternatives were discussed Type: abscess  Body area: perirectal  Anesthesia: local infiltration  Incision was made with a scalpel.  Local anesthetic: lidocaine 2% w epinephrine  Anesthetic total: 4 ml  Complexity: complex Blunt dissection to break up loculations  Drainage: purulent  Drainage amount: large  Packing material: 1/4 in iodoform gauze  Patient tolerance: Patient tolerated the procedure well with no immediate complications.    MDM   Final diagnoses:  Perirectal abscess    Patient with perirectal abscess. Pain is out of proportion to the finding, abscess is small, less than 2 cm just adjacent to the sphincter. He does not tolerate rectal exam. Will get CT pelvis for evaluation of the size and extension of the abscess.    7:09 PM Patient with abscess to right buttock. CT obtained showing no involvement of pelvic tissue. Abscess incised and drained with large drainage. Two incisions had to be made, with 2nd one resulting in large purulent drainage. It was packed. Pt received 600mg  of clindamycin. Home with clindamycin and follow up in two days for recheck.   Filed Vitals:   11/18/13 1503  BP: 134/78  Pulse: 105  Temp: 98.1 F (36.7 C)  TempSrc: Oral  Resp: 16  SpO2: 99%     Lottie Musselatyana A Shaylon Aden, PA-C 11/18/13 1911

## 2013-11-18 NOTE — ED Notes (Signed)
He states he was recently seen here for "rectal abscess" and was prescribed antibiotics.  He states that, the antibiotics notwithstanding, "it is getting worse".  His position of comfort is to lean over a bedside table.

## 2013-11-18 NOTE — Discharge Instructions (Signed)
Ibuprofen for pain. norco for severe pain. Clindamycin for infection. return in two days for recheck. Keep rectal area clean.     Peri-Rectal Abscess Your caregiver has diagnosed you as having a peri-rectal abscess. This is an infected area near the rectum that is filled with pus. If the abscess is near the surface of the skin, your caregiver may open (incise) the area and drain the pus. HOME CARE INSTRUCTIONS   If your abscess was opened up and drained. A small piece of gauze may be placed in the opening so that it can drain. Do not remove the gauze unless directed by your caregiver.  A loose dressing may be placed over the abscess site. Change the dressing as often as necessary to keep it clean and dry.  After the drain is removed, the area may be washed with a gentle antiseptic (soap) four times per day.  A warm sitz bath, warm packs or heating pad may be used for pain relief, taking care not to burn yourself.  Return for a wound check in 1 day or as directed.  An "inflatable doughnut" may be used for sitting with added comfort. These can be purchased at a drugstore or medical supply house.  To reduce pain and straining with bowel movements, eat a high fiber diet with plenty of fruits and vegetables. Use stool softeners as recommended by your caregiver. This is especially important if narcotic type pain medications were prescribed as these may cause marked constipation.  Only take over-the-counter or prescription medicines for pain, discomfort, or fever as directed by your caregiver. SEEK IMMEDIATE MEDICAL CARE IF:   You have increasing pain that is not controlled by medication.  There is increased inflammation (redness), swelling, bleeding, or drainage from the area.  An oral temperature above 102 F (38.9 C) develops.  You develop chills or generalized malaise (feel lethargic or feel "washed out").  You develop any new symptoms (problems) you feel may be related to your present  problem. Document Released: 01/10/2000 Document Revised: 04/06/2011 Document Reviewed: 01/10/2008 [facility] Patient Information 2015 [place], [place]. This information is not intended to replace advice given to you by your health care provider. Make sure you discuss any questions you have with your health care provider.

## 2013-11-18 NOTE — ED Notes (Signed)
Patient transported to CT 

## 2013-11-21 ENCOUNTER — Emergency Department (HOSPITAL_COMMUNITY)
Admission: EM | Admit: 2013-11-21 | Discharge: 2013-11-21 | Disposition: A | Discharge status: Home / Self Care | Payer: Self-pay | Attending: Emergency Medicine | Admitting: Emergency Medicine

## 2013-11-21 ENCOUNTER — Encounter (HOSPITAL_COMMUNITY): Payer: Self-pay | Admitting: Emergency Medicine

## 2013-11-21 DIAGNOSIS — Z792 Long term (current) use of antibiotics: Secondary | ICD-10-CM | POA: Insufficient documentation

## 2013-11-21 DIAGNOSIS — I10 Essential (primary) hypertension: Secondary | ICD-10-CM | POA: Insufficient documentation

## 2013-11-21 DIAGNOSIS — K611 Rectal abscess: Secondary | ICD-10-CM | POA: Insufficient documentation

## 2013-11-21 MED ORDER — HYDROCODONE-ACETAMINOPHEN 5-325 MG PO TABS
2.0000 | ORAL_TABLET | Freq: Once | ORAL | Status: AC
  Administered 2013-11-21: 2 via ORAL
  Filled 2013-11-21: qty 2

## 2013-11-21 MED ORDER — HYDROCODONE-ACETAMINOPHEN 5-325 MG PO TABS: 1.0000 | ORAL_TABLET | ORAL | Status: DC | PRN

## 2013-11-21 NOTE — ED Provider Notes (Signed)
CSN: [id number]     Arrival date & time 11/21/13  0907 History   First MD Initiated Contact with Patient 11/21/13 [phone]     Chief Complaint  Patient presents with  . Wound Check     (Consider location/radiation/quality/duration/timing/severity/associated sxs/prior Treatment) Patient is a 28 y.o. male presenting with wound check.  Wound Check   Patient had abscess drained from right perirectal area 4 days ago. He presents for wound check and to have packing pulled. He complains of pain at wound site at right perirectal area. Nonradiating. Treated with Norco one tablet every 4 hours without adequate pain relief. No other associated symptoms. Pain moderate to severe. Worse with sitting on the area. Not improved by anything. Past Medical History  Diagnosis Date  . Hypertension    History reviewed. No pertinent past surgical history. History reviewed. No pertinent family history. History  Substance Use Topics  . Smoking status: Never Smoker   . Smokeless tobacco: Not on file  . Alcohol Use: 1.2 oz/week    2 Cans of beer per week    Review of Systems  Constitutional: Negative.   Gastrointestinal: Negative.   Genitourinary: Negative.   Skin: Positive for wound.       Perirectal pain from abscess recently drained  Allergic/Immunologic: Negative.       Allergies  Review of patient's allergies indicates no known allergies.  Home Medications   Prior to Admission medications   Medication Sig Start Date End Date Taking? Authorizing Provider  clindamycin (CLEOCIN) 150 MG capsule Take 2 capsules (300 mg total) by mouth 3 (three) times daily. 11/18/13   Tatyana A Kirichenko, PA-C  HYDROcodone-acetaminophen (NORCO/VICODIN) 5-325 MG per tablet Take 1-2 tablets by mouth every 4 (four) hours as needed. 11/15/13   Shari A Upstill, PA-C  HYDROcodone-acetaminophen (NORCO/VICODIN) 5-325 MG per tablet Take 1 tablet by mouth every 4 (four) hours as needed for moderate pain or severe pain.  11/18/13   Tatyana A Kirichenko, PA-C  sulfamethoxazole-trimethoprim (BACTRIM) 400-80 MG per tablet Take 1 tablet by mouth 2 (two) times daily. 11/15/13   Shari A Upstill, PA-C   BP 135/76  Pulse 66  Temp(Src) 97.4 F (36.3 C) (Oral)  Resp 16  Ht 5\' 5"  (1.651 m)  Wt 160 lb (72.576 kg)  BMI 26.63 kg/m2  SpO2 100% Physical Exam  Nursing note and vitals reviewed. Constitutional: He appears well-developed and well-nourished. No distress.  HENT:  Head: Normocephalic and atraumatic.  Right Ear: External ear normal.  Left Ear: External ear normal.  Eyes: Conjunctivae are normal.  Neck: Neck supple.  Cardiovascular: Normal rate.   Abdominal: He exhibits no distension.  Genitourinary:  Right perirectal area with 1 cm incision packed. No surrounding redness or swelling there is surrounding tenderness. Packing was removed to find cleaned wound with no draiage, minimal yellowish drainage on the packing material    ED Course  Procedures (including critical care time) Labs Review Labs Reviewed - No data to display  Imaging Review No results found.   EKG Interpretation None       Final diagnoses:  None    plan prescription Norco as patient has only 10 tablets left. Warm soaks.  Return if condition worsens or see urgent care center  Diagnosis perirectal abscess      Doug SouSam Monai Hindes, MD 11/21/13 [phone]

## 2013-11-21 NOTE — ED Notes (Signed)
Pt needs packing wound from boil/abscess to buttocks

## 2013-11-21 NOTE — Progress Notes (Signed)
[facility] Stacy,  Did not get to see patient but will be sending information on [facility] [employer] program, to help patient establish primary care, using the address provided.

## 2013-11-21 NOTE — Discharge Instructions (Signed)
Abscess Care After Soon warm bathtub or let warm water from shower and hit the wound 4 times daily for 30 minutes at a time. Take Tylenol for mild pain or the pain medicine prescribed for back pain. Return or see an urgent care center if your condition worsens for any reason An abscess (also called a boil or furuncle) is an infected area that contains a collection of pus. Signs and symptoms of an abscess include pain, tenderness, redness, or hardness, or you may feel a moveable soft area under your skin. An abscess can occur anywhere in the body. The infection may spread to surrounding tissues causing cellulitis. A cut (incision) by the surgeon was made over your abscess and the pus was drained out. Gauze may have been packed into the space to provide a drain that will allow the cavity to heal from the inside outwards. The boil may be painful for 5 to 7 days. Most people with a boil do not have high fevers. Your abscess, if seen early, may not have localized, and may not have been lanced. If not, another appointment may be required for this if it does not get better on its own or with medications. HOME CARE INSTRUCTIONS   Only take over-the-counter or prescription medicines for pain, discomfort, or fever as directed by your caregiver.  When you bathe, soak and then remove gauze or iodoform packs at least daily or as directed by your caregiver. You may then wash the wound gently with mild soapy water. Repack with gauze or do as your caregiver directs. SEEK IMMEDIATE MEDICAL CARE IF:   You develop increased pain, swelling, redness, drainage, or bleeding in the wound site.  You develop signs of generalized infection including muscle aches, chills, fever, or a general ill feeling.  An oral temperature above 102 F (38.9 C) develops, not controlled by medication. See your caregiver for a recheck if you develop any of the symptoms described above. If medications (antibiotics) were prescribed, take them as  directed. Document Released: 07/31/2004 Document Revised: 04/06/2011 Document Reviewed: 03/28/2007 [facility]Exit[facility] Patient Information 2015 [place], [place]. This information is not intended to replace advice given to you by your health care provider. Make sure you discuss any questions you have with your health care provider.

## 2014-02-14 ENCOUNTER — Encounter (HOSPITAL_COMMUNITY): Payer: Self-pay | Admitting: Emergency Medicine

## 2014-02-14 ENCOUNTER — Emergency Department (HOSPITAL_COMMUNITY)
Admission: EM | Admit: 2014-02-14 | Discharge: 2014-02-14 | Disposition: A | Discharge status: Home / Self Care | Payer: Self-pay | Attending: Emergency Medicine | Admitting: Emergency Medicine

## 2014-02-14 DIAGNOSIS — N342 Other urethritis: Secondary | ICD-10-CM | POA: Insufficient documentation

## 2014-02-14 DIAGNOSIS — I10 Essential (primary) hypertension: Secondary | ICD-10-CM | POA: Insufficient documentation

## 2014-02-14 MED ORDER — LIDOCAINE HCL (PF) 1 % IJ SOLN
INTRAMUSCULAR | Status: AC
  Administered 2014-02-14: 1 mL
  Filled 2014-02-14: qty 5

## 2014-02-14 MED ORDER — CEFTRIAXONE SODIUM 250 MG IJ SOLR
250.0000 mg | Freq: Once | INTRAMUSCULAR | Status: AC
  Administered 2014-02-14: 250 mg via INTRAMUSCULAR
  Filled 2014-02-14: qty 250

## 2014-02-14 MED ORDER — METRONIDAZOLE 500 MG PO TABS
2000.0000 mg | ORAL_TABLET | Freq: Once | ORAL | Status: AC
  Administered 2014-02-14: 2000 mg via ORAL
  Filled 2014-02-14: qty 4

## 2014-02-14 MED ORDER — AZITHROMYCIN 250 MG PO TABS
1000.0000 mg | ORAL_TABLET | Freq: Once | ORAL | Status: AC
  Administered 2014-02-14: 1000 mg via ORAL
  Filled 2014-02-14: qty 4

## 2014-02-14 NOTE — Discharge Instructions (Signed)
You were treated today for STI. No intercourse for a week. When cultures come back, if they are positive you will be contacted. Follow up with health dept.    Urethritis Urethritis is an inflammation of the tube through which urine exits your bladder (urethra).  CAUSES Urethritis is often caused by an infection in your urethra. The infection can be viral, like herpes. The infection can also be bacterial, like gonorrhea. RISK FACTORS Risk factors of urethritis include:  Having sex without using a condom.  Having multiple sexual partners.  Having poor hygiene. SIGNS AND SYMPTOMS Symptoms of urethritis are less noticeable in women than in men. These symptoms include:  Burning feeling when you urinate (dysuria).  Discharge from your urethra.  Blood in your urine (hematuria).  Urinating more than usual. DIAGNOSIS  To confirm a diagnosis of urethritis, your health care provider will do the following:  Ask about your sexual history.  Perform a physical exam.  Have you provide a sample of your urine for lab testing.  Use a cotton swab to gently collect a sample from your urethra for lab testing. TREATMENT  It is important to treat urethritis. Depending on the cause, untreated urethritis may lead to serious genital infections and possibly infertility. Urethritis caused by a bacterial infection is treated with antibiotic medicine. All sexual partners must be treated.  HOME CARE INSTRUCTIONS  Do not have sex until the test results are known and treatment is completed, even if your symptoms go away before you finish treatment.  If you were prescribed an antibiotic, finish it all even if you start to feel better. SEEK MEDICAL CARE IF:   Your symptoms are not improved in 3 days.  Your symptoms are getting worse.  You develop abdominal pain or pelvic pain (in women).  You develop joint pain.  You have a fever. SEEK IMMEDIATE MEDICAL CARE IF:   You have severe pain in the  belly, back, or side.  You have repeated vomiting. MAKE SURE YOU:  Understand these instructions.  Will watch your condition.  Will get help right away if you are not doing well or get worse. Document Released: 07/08/2000 Document Revised: 05/29/2013 Document Reviewed: 09/12/2012 [facility] Patient Information 2015 [place]. This information is not intended to replace advice given to you by your health care provider. Make sure you discuss any questions you have with your health care provider.

## 2014-02-14 NOTE — ED Notes (Addendum)
Pt c/o "tingling in penis" x 5 days. Denies "discharge" but says "there ain't no discharge..there's like a liquerd comin' out".

## 2014-02-14 NOTE — ED Provider Notes (Signed)
CSN: [id number]     Arrival date & time 02/14/14  1132 History  This chart was scribed for non-physician practitioner working with Geoffery Lyons, MD by Elveria Rising, ED Scribe. This patient was seen in room WTR6/WTR6 and the patient's care was started at 12:17 PM.   Chief Complaint  Patient presents with  . Penis Problem    The history is provided by the patient. No language interpreter was used.   HPI Comments: Carlos Meyers is a 29 y.o. male with PMHx Hypertension who presents to the Emergency Department for suspected STD exposure. Patient reports monogamous sexual relationship with a single partner for 7 years. Patient is unsure of her fidelity. Patient reports "tingling" with urination and clear, watery discharge. Patient denies pain with in his scrotum. Patient denies history of past genital infection.   Past Medical History  Diagnosis Date  . Hypertension    No past surgical history on file. No family history on file. History  Substance Use Topics  . Smoking status: Never Smoker   . Smokeless tobacco: Not on file  . Alcohol Use: 1.2 oz/week    2 Cans of beer per week    Review of Systems  Constitutional: Negative for fever and chills.  Genitourinary: Positive for dysuria and discharge. Negative for scrotal swelling and genital sores.  Skin: Negative for rash.    Allergies  Review of patient's allergies indicates no known allergies.  Home Medications   Prior to Admission medications   Medication Sig Start Date End Date Taking? Authorizing Provider  clindamycin (CLEOCIN) 150 MG capsule Take 2 capsules (300 mg total) by mouth 3 (three) times daily. Patient not taking: Reported on 02/14/2014 11/18/13   Ariyan Brisendine A Jacques Fife, PA-C  HYDROcodone-acetaminophen (NORCO/VICODIN) 5-325 MG per tablet Take 1-2 tablets by mouth every 4 (four) hours as needed. Patient not taking: Reported on 02/14/2014 11/15/13   Melvenia Beam A Upstill, PA-C  HYDROcodone-acetaminophen (NORCO/VICODIN) 5-325  MG per tablet Take 1 tablet by mouth every 4 (four) hours as needed for moderate pain or severe pain. Patient not taking: Reported on 02/14/2014 11/18/13   Ellamay Fors A Yeiden Frenkel, PA-C  HYDROcodone-acetaminophen (NORCO/VICODIN) 5-325 MG per tablet Take 1-2 tablets by mouth every 4 (four) hours as needed for moderate pain or severe pain. Patient not taking: Reported on 02/14/2014 11/21/13   Doug Sou, MD  sulfamethoxazole-trimethoprim (BACTRIM) 400-80 MG per tablet Take 1 tablet by mouth 2 (two) times daily. Patient not taking: Reported on 02/14/2014 11/15/13   Arnoldo Hooker, PA-C   Triage Vitals: BP 146/76 mmHg  Pulse 71  Temp(Src) 98.7 F (37.1 C) (Oral)  Resp 18  SpO2 100% Physical Exam  Constitutional: He is oriented to person, place, and time. He appears well-developed and well-nourished. No distress.  HENT:  Head: Normocephalic and atraumatic.  Eyes: EOM are normal.  Neck: Neck supple. No tracheal deviation present.  Cardiovascular: Normal rate.   Pulmonary/Chest: Effort normal. No respiratory distress.  Genitourinary: Testes normal. Circumcised. Discharge found.  Clear penile discharge  Musculoskeletal: Normal range of motion.  Neurological: He is alert and oriented to person, place, and time.  Skin: Skin is warm and dry.  Psychiatric: He has a normal mood and affect. His behavior is normal.  Nursing note and vitals reviewed.   ED Course  Procedures (including critical care time)  COORDINATION OF CARE: 12:24 PM- Patient agrees to STD testing. Discussed treatment plan with patient at bedside and patient agreed to plan.   Labs Review Labs Reviewed - No  data to display  Imaging Review No results found.   EKG Interpretation None      MDM   Final diagnoses:  Urethritis    Pt with penile discharge and burning sensation. consitent with STI. Treated with rocephin 250mg  IM, zithromax 1g po, flagyl 2g PO. Follow up with health dept.   Filed Vitals:   02/14/14  1137  BP: 146/76  Pulse: 71  Temp: 98.7 F (37.1 C)  TempSrc: Oral  Resp: 18  SpO2: 100%     I personally performed the services described in this documentation, which was scribed in my presence. The recorded information has been reviewed and is accurate.    Lottie Musselatyana A Shanedra Lave, PA-C 02/14/14 1325  Geoffery Lyonsouglas Delo, MD 02/14/14 [phone]

## 2014-02-15 LAB — RPR: RPR Ser Ql: NONREACTIVE

## 2014-02-15 LAB — HIV ANTIBODY (ROUTINE TESTING W REFLEX): HIV-1/HIV-2 Ab: NONREACTIVE

## 2014-02-15 LAB — GC/CHLAMYDIA PROBE AMP (~~LOC~~) NOT AT ARMC
Chlamydia: NEGATIVE
NEISSERIA GONORRHEA: NEGATIVE

## 2014-02-16 NOTE — Progress Notes (Signed)
Late entry for 02/14/14 Pt provided with list of self pay pcps and [employer] orange card application information from [facility] staff, Kennyth ArnoldStacy

## 2014-03-22 ENCOUNTER — Encounter (HOSPITAL_COMMUNITY): Payer: Self-pay

## 2014-03-22 ENCOUNTER — Emergency Department (HOSPITAL_COMMUNITY)
Admission: EM | Admit: 2014-03-22 | Discharge: 2014-03-22 | Disposition: A | Discharge status: Home / Self Care | Payer: Self-pay | Attending: Emergency Medicine | Admitting: Emergency Medicine

## 2014-03-22 DIAGNOSIS — H579 Unspecified disorder of eye and adnexa: Secondary | ICD-10-CM

## 2014-03-22 DIAGNOSIS — I10 Essential (primary) hypertension: Secondary | ICD-10-CM | POA: Insufficient documentation

## 2014-03-22 DIAGNOSIS — H53149 Visual discomfort, unspecified: Secondary | ICD-10-CM | POA: Insufficient documentation

## 2014-03-22 DIAGNOSIS — M25511 Pain in right shoulder: Secondary | ICD-10-CM | POA: Insufficient documentation

## 2014-03-22 DIAGNOSIS — H1089 Other conjunctivitis: Secondary | ICD-10-CM | POA: Insufficient documentation

## 2014-03-22 LAB — COMPREHENSIVE METABOLIC PANEL WITH GFR
ALT: 12 U/L (ref 0–53)
AST: 15 U/L (ref 0–37)
Albumin: 4.4 g/dL (ref 3.5–5.2)
Alkaline Phosphatase: 65 U/L (ref 39–117)
Anion gap: 8 (ref 5–15)
BUN: 7 mg/dL (ref 6–23)
CO2: 27 mmol/L (ref 19–32)
Calcium: 9.6 mg/dL (ref 8.4–10.5)
Chloride: 104 mmol/L (ref 96–112)
Creatinine, Ser: 1.08 mg/dL (ref 0.50–1.35)
GFR calc Af Amer: >90 mL/min
GFR calc non Af Amer: >90 mL/min
Glucose, Bld: 110 mg/dL — ABNORMAL HIGH (ref 70–99)
Potassium: 4.2 mmol/L (ref 3.5–5.1)
Sodium: 139 mmol/L (ref 135–145)
Total Bilirubin: 1.9 mg/dL — ABNORMAL HIGH (ref 0.3–1.2)
Total Protein: 7.1 g/dL (ref 6.0–8.3)

## 2014-03-22 LAB — CBC WITH DIFFERENTIAL/PLATELET
Basophils Absolute: 0 10*3/uL (ref 0.0–0.1)
Basophils Relative: 0 % (ref 0–1)
Eosinophils Absolute: 0.5 10*3/uL (ref 0.0–0.7)
Eosinophils Relative: 7 % — ABNORMAL HIGH (ref 0–5)
HCT: 48.9 % (ref 39.0–52.0)
HEMOGLOBIN: 17 g/dL (ref 13.0–17.0)
LYMPHS ABS: 2.1 10*3/uL (ref 0.7–4.0)
Lymphocytes Relative: 26 % (ref 12–46)
MCH: 30.4 pg (ref 26.0–34.0)
MCHC: 34.8 g/dL (ref 30.0–36.0)
MCV: 87.5 fL (ref 78.0–100.0)
MONOS PCT: 8 % (ref 3–12)
Monocytes Absolute: 0.7 10*3/uL (ref 0.1–1.0)
Neutro Abs: 4.7 10*3/uL (ref 1.7–7.7)
Neutrophils Relative %: 59 % (ref 43–77)
Platelets: 182 10*3/uL (ref 150–400)
RBC: 5.59 MIL/uL (ref 4.22–5.81)
RDW: 12.8 % (ref 11.5–15.5)
WBC: 8 10*3/uL (ref 4.0–10.5)

## 2014-03-22 LAB — RETICULOCYTES
RBC.: 5.59 MIL/uL (ref 4.22–5.81)
Retic Count, Absolute: 50.3 K/uL (ref 19.0–186.0)
Retic Ct Pct: 0.9 % (ref 0.4–3.1)

## 2014-03-22 MED ORDER — TETRACAINE HCL 0.5 % OP SOLN
2.0000 [drp] | Freq: Once | OPHTHALMIC | Status: AC
  Administered 2014-03-22: 2 [drp] via OPHTHALMIC
  Filled 2014-03-22: qty 2

## 2014-03-22 MED ORDER — FLUORESCEIN SODIUM 1 MG OP STRP
1.0000 | ORAL_STRIP | Freq: Once | OPHTHALMIC | Status: AC
  Administered 2014-03-22: 1 via OPHTHALMIC
  Filled 2014-03-22: qty 1

## 2014-03-22 MED ORDER — KETOROLAC TROMETHAMINE 60 MG/2ML IM SOLN
60.0000 mg | Freq: Once | INTRAMUSCULAR | Status: DC
  Filled 2014-03-22: qty 2

## 2014-03-22 NOTE — ED Notes (Signed)
MD at bedside. 

## 2014-03-22 NOTE — ED Notes (Signed)
Pt reports pain in rt eye x 3-4 days and started seeing "black spots" in visual field today. Pt denies dizziness or lightheadedness. Pt alert, oriented, nad.

## 2014-03-22 NOTE — Discharge Instructions (Signed)
Is important for you to [facility]. Randon GoldsmithLyles for further evaluation and management of your eye pain. They will call you in the morning to schedule an appointment. If you have not heard from them by 9:00 please call them to confirm. Return to ED for new or worsening symptoms.

## 2014-03-22 NOTE — ED Notes (Signed)
Pt reporting sickle cell pain.  Pt sts he has never been diagnosed but that "all my kids have it and their mommas don't so I know it's gotta be me who has it".  Reporting pain to back and right eye.  Sts he has been seeing black spots.

## 2014-03-22 NOTE — ED Provider Notes (Signed)
The patient is a 29 year old male, he has a pain in his right eye which feels like a sharp and shooting pain going from the back of his head through his right eye. This started 4 days ago, it is associated with redness and a tearing. There has been no purulent drainage and no change in vision until today when he noticed some black floaters. This has been ongoing and continual, mild, not associated with fevers chills nausea or vomiting. He does have mild photophobia. He denies injury to the eye. Denies purulence. Denies crusting. Denies matting. On exam the patient has an isolated unilateral mild erythema to the right eye conjunctiva, sluggish pupil which is reactive, no consensual pain, normal extraocular movements, visual acuity and peripheral visual fields tested, see physician assistant Cartner notes. Optic disks are visualized bilaterally and are visualized without papilledema. Otherwise the patient has no sinus tenderness, clear oropharynx, no trismus or torticollis, normal speech, no lymphadenopathy around the ears or the neck.  The patient does not have sickle cell by history nor by labs, he appears otherwise well, will discussed with ophthalmology prior to discharge. Suspect uveitis.  With tetracaine and floor seen, I examined without signs of corneal abrasion or injury, no conjunctival injury or abrasion or laceration, normal pupil reactivity under slitlamp exam. No cells or flare seen on slit-lamp exam.  Medical screening examination/treatment/procedure(s) were conducted as a shared visit with non-physician practitioner(s) and myself.  I personally evaluated the patient during the encounter.  Clinical Impression:   Final diagnoses:  Eye problem         Vida RollerBrian D Caydin Yeatts, MD 03/23/14 [phone]

## 2014-03-22 NOTE — ED Notes (Signed)
Eye equipment placed at bedside

## 2014-03-22 NOTE — ED Provider Notes (Signed)
CSN: [id number]     Arrival date & time 03/22/14  1438 History   First MD Initiated Contact with Patient 03/22/14 1623     Chief Complaint  Patient presents with  . Sickle Cell Pain Crisis  . Eye Pain     (Consider location/radiation/quality/duration/timing/severity/associated sxs/prior Treatment) HPI Carlos Meyers is a 29 y.o. male with history of hypertension comes in for evaluation of right eye pain and right shoulder pain. Pt denies any injury. Patient states for the past 3 days he has had a gradual onset, sharp shooting pain in his right eye that radiates "from back to front". He also reports today that he has been seeing "black spots that look like they're following". He rates his eye discomfort as a 7/10. Eye discomfort is exacerbated when he looks at the light. No relieving factors He has not tried anything for his symptoms. He denies any changes in vision, discharge. He also reports an intermittent right shoulder soreness that has also been going on for the past 3 or 4 days. He believes this might be due to sickle cell. He reports that he has 4 kids by different mothers and all of his kids have sickle cell and none of his mothers do. He denies any headache, chest pain, shortness of breath, fevers at home. He has not tried anything for his shoulder discomfort. He does maintain full range of motion and denies any numbness or weakness or cold sensations in his hand. Denies any injury.  Past Medical History  Diagnosis Date  . Hypertension    History reviewed. No pertinent past surgical history. History reviewed. No pertinent family history. History  Substance Use Topics  . Smoking status: Never Smoker   . Smokeless tobacco: Not on file  . Alcohol Use: 1.2 oz/week    2 Cans of beer per week    Review of Systems  Constitutional: Negative for fever.  Eyes: Positive for photophobia, pain and redness. Negative for discharge, itching and visual disturbance.  Genitourinary: Negative  for discharge and penile pain.  Musculoskeletal: Positive for myalgias.  All other systems reviewed and are negative.     Allergies  Review of patient's allergies indicates no known allergies.  Home Medications   Prior to Admission medications   Medication Sig Start Date End Date Taking? Authorizing Provider  clindamycin (CLEOCIN) 150 MG capsule Take 2 capsules (300 mg total) by mouth 3 (three) times daily. Patient not taking: Reported on 02/14/2014 11/18/13   Tatyana A Kirichenko, PA-C  HYDROcodone-acetaminophen (NORCO/VICODIN) 5-325 MG per tablet Take 1-2 tablets by mouth every 4 (four) hours as needed. Patient not taking: Reported on 02/14/2014 11/15/13   Melvenia Beam A Upstill, PA-C  HYDROcodone-acetaminophen (NORCO/VICODIN) 5-325 MG per tablet Take 1 tablet by mouth every 4 (four) hours as needed for moderate pain or severe pain. Patient not taking: Reported on 02/14/2014 11/18/13   Tatyana A Kirichenko, PA-C  HYDROcodone-acetaminophen (NORCO/VICODIN) 5-325 MG per tablet Take 1-2 tablets by mouth every 4 (four) hours as needed for moderate pain or severe pain. Patient not taking: Reported on 02/14/2014 11/21/13   Doug Sou, MD  sulfamethoxazole-trimethoprim (BACTRIM) 400-80 MG per tablet Take 1 tablet by mouth 2 (two) times daily. Patient not taking: Reported on 02/14/2014 11/15/13   Melvenia Beam A Upstill, PA-C   BP 128/88 mmHg  Pulse 88  Temp(Src) 98.4 F (36.9 C) (Oral)  Resp 16  Wt 152 lb (68.947 kg)  SpO2 100% Physical Exam  Constitutional: He is oriented to person, place, and  time. He appears well-developed and well-nourished.  HENT:  Head: Normocephalic and atraumatic.  Mouth/Throat: Oropharynx is clear and moist.  Eyes: EOM are normal. Left eye exhibits no discharge. No scleral icterus.  Unilateral right eye conjunctivitis with mild tearing. Pupil is sluggish but is reactive. No abrasion or foreign body seen with Wood's lamp examination. Slit lamp evaluation unremarkable, no  cells or flare. Tonometry completed with intraocular pressure of 11 in the right eye and 17 in the left eye. No visual field deficits. Extraocular movements are intact without discomfort.  Neck: Neck supple.  Cardiovascular: Normal rate, regular rhythm and normal heart sounds.   Pulmonary/Chest: Effort normal and breath sounds normal. No respiratory distress. He has no wheezes. He has no rales.  Abdominal: Soft. There is no tenderness.  Musculoskeletal: He exhibits no tenderness.  Neurological: He is alert and oriented to person, place, and time.  Cranial Nerves II-XII grossly intact  Skin: Skin is warm and dry. No rash noted.  Psychiatric: He has a normal mood and affect.  Nursing note and vitals reviewed.   ED Course  Procedures (including critical care time) Labs Review Labs Reviewed  CBC WITH DIFFERENTIAL/PLATELET - Abnormal; Notable for the following:    Eosinophils Relative 7 (*)    All other components within normal limits  COMPREHENSIVE METABOLIC PANEL - Abnormal; Notable for the following:    Glucose, Bld 110 (*)    Total Bilirubin 1.9 (*)    All other components within normal limits  RETICULOCYTES  Ultrasound: Limited Ocular  Performed and interpreted by Mayme GentaBen Jaziel Bennett Indication: Right eye pain Using high frequency linear probe, ultrasound of the globe was performed in real time in two planes with patient looking left and right.  Interpretation: No retinal detachment visualized.  Lens was in proper location.  No hemorrhage appreciated.  Images were electronically archived.    Imaging Review No results found.   EKG Interpretation None     Meds given in ED:  Medications  ketorolac (TORADOL) injection 60 mg (0 mg Intramuscular Hold 03/22/14 1726)  tetracaine (PONTOCAINE) 0.5 % ophthalmic solution 2 drop (2 drops Right Eye Given 03/22/14 1730)  fluorescein ophthalmic strip 1 strip (1 strip Right Eye Given 03/22/14 1730)    New Prescriptions   No medications on file    Filed Vitals:   03/22/14 1453 03/22/14 1544 03/22/14 1700  BP:  114/75 128/88  Pulse:  60 88  Temp:  98.4 F (36.9 C)   TempSrc:  Oral   Resp:  16 16  Weight: 152 lb (68.947 kg)    SpO2:  96% 100%     MDM  Vitals stable - WNL -afebrile Pt resting comfortably in ED. PE--extraocular movements are intact without discomfort, visual fields intact. Slit lamp exam unremarkable, no foreign bodies, no corneal abrasions. Intraocular pressures: R 11, L 17. Ultrasound shows no evidence of retinal detachment, lens is in place. Labs are noncontributory. No anemia, normal reticulocyte Reports that he is not concerned about his shoulder at this time and does not want any treatment for it. Discussed follow-up with PCP for testing, further evaluation of sickle cell disease. Patient agrees.  Consult Ophthalmology, Dr. Lavell IslamLyles/ [facility] will see in AM for possible iritis TBD. I discussed all relevant lab findings and imaging results with pt and they verbalized understanding. Discussed f/u with PCP within 48 hrs and return precautions, pt very amenable to plan.  Prior to patient discharge, I discussed and reviewed this case with attending, Dr. Hyacinth MeekerMiller, who  also saw and evaluated the patient.    Final diagnoses:  Eye problem        Sharlene Motts, PA-C 03/22/14 1908  Vida Roller, MD 03/23/14 [phone]

## 2014-05-22 ENCOUNTER — Emergency Department (HOSPITAL_COMMUNITY)
Admission: EM | Admit: 2014-05-22 | Discharge: 2014-05-22 | Disposition: A | Discharge status: Home / Self Care | Payer: Self-pay | Source: Home / Self Care | Attending: Family Medicine | Admitting: Family Medicine

## 2014-05-22 ENCOUNTER — Other Ambulatory Visit (HOSPITAL_COMMUNITY)
Admission: RE | Admit: 2014-05-22 | Discharge: 2014-05-22 | Disposition: A | Discharge status: Home / Self Care | Payer: Self-pay | Source: Ambulatory Visit | Attending: Family Medicine | Admitting: Family Medicine

## 2014-05-22 ENCOUNTER — Encounter (HOSPITAL_COMMUNITY): Payer: Self-pay | Admitting: Emergency Medicine

## 2014-05-22 DIAGNOSIS — R369 Urethral discharge, unspecified: Secondary | ICD-10-CM

## 2014-05-22 DIAGNOSIS — Z113 Encounter for screening for infections with a predominantly sexual mode of transmission: Secondary | ICD-10-CM | POA: Insufficient documentation

## 2014-05-22 DIAGNOSIS — Z202 Contact with and (suspected) exposure to infections with a predominantly sexual mode of transmission: Secondary | ICD-10-CM

## 2014-05-22 LAB — POCT URINALYSIS DIP (DEVICE)
Bilirubin Urine: NEGATIVE
GLUCOSE, UA: NEGATIVE mg/dL
Hgb urine dipstick: NEGATIVE
Ketones, ur: NEGATIVE mg/dL
NITRITE: NEGATIVE
Protein, ur: NEGATIVE mg/dL
Specific Gravity, Urine: 1.025 (ref 1.005–1.030)
UROBILINOGEN UA: 0.2 mg/dL (ref 0.0–1.0)
pH: 6 (ref 5.0–8.0)

## 2014-05-22 MED ORDER — LIDOCAINE HCL (PF) 1 % IJ SOLN
INTRAMUSCULAR | Status: AC
  Filled 2014-05-22: qty 5

## 2014-05-22 MED ORDER — CEFTRIAXONE SODIUM 250 MG IJ SOLR
250.0000 mg | Freq: Once | INTRAMUSCULAR | Status: AC
  Administered 2014-05-22: 250 mg via INTRAMUSCULAR

## 2014-05-22 MED ORDER — AZITHROMYCIN 250 MG PO TABS
ORAL_TABLET | ORAL | Status: AC
Start: 2014-05-22 — End: 2014-05-22
  Filled 2014-05-22: qty 4

## 2014-05-22 MED ORDER — AZITHROMYCIN 250 MG PO TABS
1000.0000 mg | ORAL_TABLET | Freq: Every day | ORAL | Status: DC
  Administered 2014-05-22: 1000 mg via ORAL

## 2014-05-22 MED ORDER — CEFTRIAXONE SODIUM 250 MG IJ SOLR
INTRAMUSCULAR | Status: AC
  Filled 2014-05-22: qty 250

## 2014-05-22 NOTE — ED Provider Notes (Signed)
CSN: [id number]     Arrival date & time 05/22/14  [id number] History   First MD Initiated Contact with Patient 05/22/14 1014     Chief Complaint  Patient presents with  . Penile Discharge   (Consider location/radiation/quality/duration/timing/severity/associated sxs/prior Treatment) HPI Comments: 29 year old male complaining of a penile discharge 3 days. It is yellowish in color. He denies urinary symptoms such as dysuria or frequency. He has been here several times in the past for STD exposure and symptoms.    Past Medical History  Diagnosis Date  . Hypertension    History reviewed. No pertinent past surgical history. History reviewed. No pertinent family history. History  Substance Use Topics  . Smoking status: Never Smoker   . Smokeless tobacco: Not on file  . Alcohol Use: 1.2 oz/week    2 Cans of beer per week    Review of Systems  Constitutional: Negative.   Respiratory: Negative.   Gastrointestinal: Negative.   Genitourinary: Positive for discharge. Negative for dysuria, urgency, frequency, flank pain, decreased urine volume, penile swelling, scrotal swelling, penile pain and testicular pain.    Allergies  Review of patient's allergies indicates no known allergies.  Home Medications   Prior to Admission medications   Not on File   BP 132/72 mmHg  Pulse 81  Temp(Src) 98.6 F (37 C) (Oral)  Resp 16  SpO2 97% Physical Exam  Constitutional: He is oriented to person, place, and time. He appears well-developed and well-nourished. No distress.  Neck: Normal range of motion. Neck supple.  Cardiovascular: Normal rate.   Pulmonary/Chest: Effort normal. No respiratory distress.  Genitourinary: No penile tenderness.  Musculoskeletal: Normal range of motion. He exhibits no edema.  Neurological: He is alert and oriented to person, place, and time.  Skin: Skin is warm and dry.  Nursing note and vitals reviewed.   ED Course  Procedures (including critical care time) Labs  Review Labs Reviewed  URINE CYTOLOGY ANCILLARY ONLY    Imaging Review No results found.   MDM   1. Abnormal penile discharge   2. Potential exposure to STD    Azithromycin po 1 gm. Rocephin 250 mg IM Instructions for STD Urine cytology pending   Hayden Rasmussenavid Kalasia Crafton, NP 05/22/14 1043

## 2014-05-22 NOTE — ED Notes (Signed)
C/o penile discharge since Sunday.   Denies pelvic/abdominal pain.   No fever, n/v/d.   No urinary symptoms.

## 2014-05-22 NOTE — Discharge Instructions (Signed)
Gonorrhea °Gonorrhea is an infection that can cause serious problems. If left untreated, the infection may:  °· Damage the male or male organs.   °· Cause women to be unable to have children (sterility).   °· Harm a fetus if the infected woman is pregnant.   °It is important to get treatment for gonorrhea as soon as possible. It is also necessary that all your sexual partners be tested for the infection.  °CAUSES  °Gonorrhea is caused by bacteria called Neisseria gonorrhoeae. The infection is spread from person to person, usually by sexual contact (such as by anal, vaginal, or oral means). A newborn can contract the infection from his or her mother during birth.  °SYMPTOMS  °Some people with gonorrhea do not have symptoms. Symptoms may be different in females and males.  °Females  °The most common symptoms are:  °· Pain in the lower abdomen.   °· Fever with or without chills.   °Other symptoms include:  °· Abnormal vaginal discharge.   °· Painful intercourse.   °· Burning or itching of the vagina or lips of the vagina.   °· Abnormal vaginal bleeding.   °· Pain when urinating.   °· Long-lasting (chronic) pain in the lower abdomen, especially during menstruation or intercourse.   °· Inability to become pregnant.   °· Going into premature labor.   °· Irritation, pain, bleeding, or discharge from the rectum. This may occur if the infection was spread by anal sex.   °· Sore throat or swollen lymph nodes in the neck. This may occur if the infection was spread by oral sex.   °Males  °The most common symptoms are:  °· Discharge from the penis.   °· Pain or burning during urination.   °· Pain or swelling in the testicles. °Other symptoms may include:  °· Irritation, pain, bleeding, or discharge from the rectum. This may occur if the infection was spread by anal sex.   °· Sore throat, fever, or swollen lymph nodes in the neck. This may occur if the infection was spread by oral sex.   °DIAGNOSIS  °A diagnosis is made after a  physical exam is done and a sample of discharge is examined under a microscope for the presence of the bacteria. The discharge may be taken from the urethra, cervix, throat, or rectum.  °TREATMENT  °Gonorrhea is treated with antibiotic medicines. It is important for treatment to begin as soon as possible. Early treatment may prevent some problems from developing.  °HOME CARE INSTRUCTIONS  °· Take medicines only as directed by your health care provider.   °· Take your antibiotic medicine as directed by your health care provider. Finish the antibiotic even if you start to feel better. Incomplete treatment will put you at risk for continued infection.   °· Do not have sex until treatment is complete or as directed by your health care provider.   °· Keep all follow-up visits as directed by your health care provider.   °· Not all test results are available during your visit. If your test results are not back during the visit, make an appointment with your health care provider to find out the results. Do not assume everything is normal if you have not heard from your health care provider or the medical facility. It is your responsibility to get your test results. °· If you test positive for gonorrhea, inform your recent sexual partners. They need to be checked for gonorrhea even if they do not have symptoms. They may need treatment, even if they test negative for gonorrhea.   °SEEK MEDICAL CARE IF:  °· You develop any bad reaction   to the medicine you were prescribed. This may include:   °¨ A rash.   °¨ Nausea.   °¨ Vomiting.   °¨ Diarrhea.   °· Your symptoms do not improve after a few days of taking antibiotics.   °· Your symptoms get worse.   °· You develop increased pain, such as in the testicles (for males) or in the abdomen (for females). °· You have a fever. °MAKE SURE YOU:  °· Understand these instructions. °· Will watch your condition. °· Will get help right away if you are not doing well or get worse. °Document  Released: 01/10/2000 Document Revised: 05/29/2013 Document Reviewed: 07/20/2012 °ExitCare® Patient Information ©2015 [employer]. This information is not intended to replace advice given to you by your health care provider. Make sure you discuss any questions you have with your health care provider. ° °Sexually Transmitted Disease °A sexually transmitted disease (STD) is a disease or infection often passed to another person during sex. However, STDs can be passed through nonsexual ways. An STD can be passed through: °· Spit (saliva). °· Semen. °· Blood. °· Mucus from the vagina. °· Pee (urine). °HOW CAN I LESSEN MY CHANCES OF GETTING AN STD? °· Use: °¨ Latex condoms. °¨ Water-soluble lubricants with condoms. Do not use petroleum jelly or oils. °¨ Dental dams. These are small pieces of latex that are used as a barrier during oral sex. °· Avoid having more than one sex partner. °· Do not have sex with someone who has other sex partners. °· Do not have sex with anyone you do not know or who is at high risk for an STD. °· Avoid risky sex that can break your skin. °· Do not have sex if you have open sores on your mouth or skin. °· Avoid drinking too much alcohol or taking illegal drugs. Alcohol and drugs can affect your good judgment. °· Avoid oral and anal sex acts. °· Get shots (vaccines) for HPV and hepatitis. °· If you are at risk of being infected with HIV, it is advised that you take a certain medicine daily to prevent HIV infection. This is called pre-exposure prophylaxis (PrEP). You may be at risk if: °¨ You are a man who has sex with other men (MSM). °¨ You are attracted to the opposite sex (heterosexual) and are having sex with more than one partner. °¨ You take drugs with a needle. °¨ You have sex with someone who has HIV. °· Talk with your doctor about if you are at high risk of being infected with HIV. If you begin to take PrEP, get tested for HIV first. Get tested every 3 months for as long as you are  taking PrEP. °WHAT SHOULD I DO IF I THINK I HAVE AN STD? °· See your doctor. °· Tell your sex partner(s) that you have an STD. They should be tested and treated. °· Do not have sex until your doctor says it is okay. °WHEN SHOULD I GET HELP? °Get help right away if: °· You have bad belly (abdominal) pain. °· You are a man and have puffiness (swelling) or pain in your testicles. °· You are a woman and have puffiness in your vagina. °Document Released: 02/20/2004 Document Revised: 01/17/2013 Document Reviewed: 07/08/2012 °ExitCare® Patient Information ©2015 [employer]. This information is not intended to replace advice given to you by your health care provider. Make sure you discuss any questions you have with your health care provider. ° °

## 2014-05-23 LAB — URINE CYTOLOGY ANCILLARY ONLY
Chlamydia: POSITIVE — AB
Neisseria Gonorrhea: POSITIVE — AB
Trichomonas: NEGATIVE

## 2014-05-28 ENCOUNTER — Telehealth (HOSPITAL_COMMUNITY): Payer: Self-pay | Admitting: *Deleted

## 2014-05-28 NOTE — ED Notes (Addendum)
GC and Chlamydia pos., Trich neg.  Pt. adequately treated with Rocephin and Zithromax.  I called pt. on home number but no answer and no way to leave a message.  Cell number gives a fast busy signal. Other home phone number verified as not his number. I called contact but no answering machine to leave a message.  Call 1.  DHHS forms x 2 [facility]. Vassie [place], Andrus Sharp M 05/28/2014 I called pt.'s contact- his mother.  I asked her to give him a message to call. She said, "if I see him I will, he does not live here." Call 2. 05/29/2014 No answer.  Unable to leave a message. Call 3. Unable to contact pt. by phone x 3. Confidential marked letter sent. 05/31/2014

## 2014-08-18 ENCOUNTER — Emergency Department (HOSPITAL_COMMUNITY): Payer: Self-pay

## 2014-08-18 ENCOUNTER — Encounter (HOSPITAL_COMMUNITY): Payer: Self-pay | Admitting: Emergency Medicine

## 2014-08-18 ENCOUNTER — Emergency Department (HOSPITAL_COMMUNITY)
Admission: EM | Admit: 2014-08-18 | Discharge: 2014-08-18 | Disposition: A | Discharge status: Home / Self Care | Payer: Self-pay | Attending: Emergency Medicine | Admitting: Emergency Medicine

## 2014-08-18 DIAGNOSIS — Z23 Encounter for immunization: Secondary | ICD-10-CM | POA: Insufficient documentation

## 2014-08-18 DIAGNOSIS — F911 Conduct disorder, childhood-onset type: Secondary | ICD-10-CM | POA: Insufficient documentation

## 2014-08-18 DIAGNOSIS — S060X9A Concussion with loss of consciousness of unspecified duration, initial encounter: Secondary | ICD-10-CM | POA: Insufficient documentation

## 2014-08-18 DIAGNOSIS — Y939 Activity, unspecified: Secondary | ICD-10-CM | POA: Insufficient documentation

## 2014-08-18 DIAGNOSIS — S0083XA Contusion of other part of head, initial encounter: Secondary | ICD-10-CM | POA: Insufficient documentation

## 2014-08-18 DIAGNOSIS — I10 Essential (primary) hypertension: Secondary | ICD-10-CM | POA: Insufficient documentation

## 2014-08-18 DIAGNOSIS — Y929 Unspecified place or not applicable: Secondary | ICD-10-CM | POA: Insufficient documentation

## 2014-08-18 DIAGNOSIS — X58XXXA Exposure to other specified factors, initial encounter: Secondary | ICD-10-CM | POA: Insufficient documentation

## 2014-08-18 DIAGNOSIS — F1010 Alcohol abuse, uncomplicated: Secondary | ICD-10-CM | POA: Insufficient documentation

## 2014-08-18 DIAGNOSIS — Y998 Other external cause status: Secondary | ICD-10-CM | POA: Insufficient documentation

## 2014-08-18 LAB — BASIC METABOLIC PANEL
Anion gap: 10 (ref 5–15)
BUN: 6 mg/dL (ref 6–20)
CO2: 23 mmol/L (ref 22–32)
Calcium: 9.2 mg/dL (ref 8.9–10.3)
Chloride: 102 mmol/L (ref 101–111)
Creatinine, Ser: 0.82 mg/dL (ref 0.61–1.24)
GFR calc Af Amer: >60 mL/min (ref >60)
GLUCOSE: 117 mg/dL — AB (ref 65–99)
Potassium: 3.4 mmol/L — ABNORMAL LOW (ref 3.5–5.1)
SODIUM: 135 mmol/L (ref 135–145)

## 2014-08-18 LAB — CBC WITH DIFFERENTIAL/PLATELET
BASOS ABS: 0 10*3/uL (ref 0.0–0.1)
Basophils Relative: 0 % (ref 0–1)
EOS ABS: 0.3 10*3/uL (ref 0.0–0.7)
Eosinophils Relative: 4 % (ref 0–5)
HEMATOCRIT: 50 % (ref 39.0–52.0)
Hemoglobin: 17.1 g/dL — ABNORMAL HIGH (ref 13.0–17.0)
LYMPHS PCT: 16 % (ref 12–46)
Lymphs Abs: 1.5 10*3/uL (ref 0.7–4.0)
MCH: 29.7 pg (ref 26.0–34.0)
MCHC: 34.2 g/dL (ref 30.0–36.0)
MCV: 86.8 fL (ref 78.0–100.0)
MONO ABS: 0.3 10*3/uL (ref 0.1–1.0)
Monocytes Relative: 3 % (ref 3–12)
NEUTROS ABS: 7.2 10*3/uL (ref 1.7–7.7)
Neutrophils Relative %: 77 % (ref 43–77)
Platelets: 193 10*3/uL (ref 150–400)
RBC: 5.76 MIL/uL (ref 4.22–5.81)
RDW: 12.9 % (ref 11.5–15.5)
WBC: 9.4 10*3/uL (ref 4.0–10.5)

## 2014-08-18 LAB — ETHANOL: ALCOHOL ETHYL (B): 357 mg/dL — AB (ref <5)

## 2014-08-18 MED ORDER — TETANUS-DIPHTH-ACELL PERTUSSIS 5-2.5-18.5 LF-MCG/0.5 IM SUSP
0.5000 mL | Freq: Once | INTRAMUSCULAR | Status: AC
  Administered 2014-08-18: 0.5 mL via INTRAMUSCULAR
  Filled 2014-08-18: qty 0.5

## 2014-08-18 NOTE — ED Notes (Signed)
Pt more calm. [employer] remain at bedside.

## 2014-08-18 NOTE — Discharge Instructions (Signed)

## 2014-08-18 NOTE — ED Notes (Signed)
Pt sleeping. [employer] is leaving however, they report if patient becomes uncooperative and combative please do not hesitate to call [employer] Per Officer Chapmon.

## 2014-08-18 NOTE — ED Notes (Addendum)
Attempted to  Call patients girlfriend to take patient home however no answer. Her name is Devoria Glassing number [phone].

## 2014-08-18 NOTE — ED Notes (Signed)
He arouses easily and ambulates without difficulty.  His "girlfriend" is here to pick him up.  He is d/c'd. Without incident.
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
## 2014-08-18 NOTE — ED Notes (Signed)
Spoke to [place] patient's girlfriend who reports she will be on her way to pick patient up in about 20 minutes.

## 2014-08-18 NOTE — ED Notes (Addendum)
MD Wickline at bedside. Pt allowing MD to examine him.
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
## 2014-08-18 NOTE — ED Notes (Signed)
Pt up and getting dressed. Pt was asked to sit back on bed due to being unsteady. He is stating " I want my mom". Phone provided for patient to call mother. Security called to bedside.
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
## 2014-09-24 ENCOUNTER — Emergency Department (HOSPITAL_COMMUNITY)
Admission: EM | Admit: 2014-09-24 | Discharge: 2014-09-24 | Disposition: A | Discharge status: Home / Self Care | Payer: Self-pay | Attending: Emergency Medicine | Admitting: Emergency Medicine

## 2014-09-24 ENCOUNTER — Emergency Department (HOSPITAL_COMMUNITY): Payer: Self-pay

## 2014-09-24 ENCOUNTER — Encounter (HOSPITAL_COMMUNITY): Payer: Self-pay | Admitting: Vascular Surgery

## 2014-09-24 DIAGNOSIS — Y998 Other external cause status: Secondary | ICD-10-CM | POA: Insufficient documentation

## 2014-09-24 DIAGNOSIS — W231XXA Caught, crushed, jammed, or pinched between stationary objects, initial encounter: Secondary | ICD-10-CM | POA: Insufficient documentation

## 2014-09-24 DIAGNOSIS — Y92810 Car as the place of occurrence of the external cause: Secondary | ICD-10-CM | POA: Insufficient documentation

## 2014-09-24 DIAGNOSIS — S60221A Contusion of right hand, initial encounter: Secondary | ICD-10-CM | POA: Insufficient documentation

## 2014-09-24 DIAGNOSIS — Y9389 Activity, other specified: Secondary | ICD-10-CM | POA: Insufficient documentation

## 2014-09-24 DIAGNOSIS — I10 Essential (primary) hypertension: Secondary | ICD-10-CM | POA: Insufficient documentation

## 2014-09-24 MED ORDER — IBUPROFEN 400 MG PO TABS
800.0000 mg | ORAL_TABLET | Freq: Once | ORAL | Status: AC
  Administered 2014-09-24: 800 mg via ORAL
  Filled 2014-09-24: qty 2

## 2014-09-24 MED ORDER — HYDROCODONE-ACETAMINOPHEN 5-325 MG PO TABS: 1.0000 | ORAL_TABLET | ORAL | Status: DC | PRN

## 2014-09-24 MED ORDER — NAPROXEN 250 MG PO TABS: 250.0000 mg | ORAL_TABLET | Freq: Two times a day (BID) | ORAL | Status: DC

## 2014-09-24 NOTE — ED Notes (Signed)
Pt reports to the ED for eval of right hand pain and swelling. Pt reports yesterday he was working on a car when a jackcame off and hit him in the hand. Significant swelling noted. Abrasion and erythema noted to knuckle. CMS intact. Pt A&OX4, resp e/u, and skin warm and dry.

## 2014-09-24 NOTE — ED Provider Notes (Signed)
CSN: [id number]     Arrival date & time 09/24/14  2006 History  This chart was scribed for non-physician practitioner, Everlene Farrier, PA-C working with Laurence Spates, MD by Gwenyth Ober, ED scribe. This patient was seen in room TR03C/TR03C and the patient's care was started at 9:22 PM    Chief Complaint  Patient presents with  . Hand Injury   The history is provided by the patient. No language interpreter was used.   HPI Comments: Carlos Meyers is a 29 y.o. right hand dominant male who presents to the Emergency Department complaining of constant, 7/10, right hand pain that started yesterday. He states swelling of his hand and an abrasion to the knuckles of his 3rd and 4th fingers as associated symptoms. Pt has not tried any treatment PTA. Pt reports that the injury occurred while he was trying to jack his car up and the jack hit his hand. His last tetanus was less than 5 years ago. Pt is right-handed. Pt denies numbness, weakness and tingling as associated symptoms.   Past Medical History  Diagnosis Date  . Hypertension    History reviewed. No pertinent past surgical history. No family history on file. Social History  Substance Use Topics  . Smoking status: Never Smoker   . Smokeless tobacco: None  . Alcohol Use: 1.2 oz/week    2 Cans of beer per week    Review of Systems  Musculoskeletal: Positive for joint swelling and arthralgias.  Skin: Positive for wound.  Neurological: Negative for weakness and numbness.    Allergies  Review of patient's allergies indicates no known allergies.  Home Medications   Prior to Admission medications   Medication Sig Start Date End Date Taking? Authorizing Provider  HYDROcodone-acetaminophen (NORCO/VICODIN) 5-325 MG per tablet Take 1 tablet by mouth every 4 (four) hours as needed for moderate pain or severe pain. 09/24/14   Everlene Farrier, PA-C  naproxen (NAPROSYN) 250 MG tablet Take 1 tablet (250 mg total) by mouth 2 (two) times  daily with a meal. 09/24/14   Everlene Farrier, PA-C   BP 143/83 mmHg  Pulse 69  Temp(Src) 98.2 F (36.8 C) (Oral)  Resp 16  SpO2 99% Physical Exam  Constitutional: He appears well-developed and well-nourished. No distress.  HENT:  Head: Normocephalic and atraumatic.  Eyes: Conjunctivae are normal. Pupils are equal, round, and reactive to light. Right eye exhibits no discharge. Left eye exhibits no discharge.  Neck: Neck supple.  Cardiovascular: Normal rate, regular rhythm and intact distal pulses.   Pulses:      Radial pulses are 2+ on the left side.  Good capillary refill at his right distal fingertips.  Pulmonary/Chest: Effort normal and breath sounds normal. No respiratory distress.  Abdominal: Soft. There is no tenderness.  Musculoskeletal: He exhibits edema and tenderness.  Right hand: Edema over dorsal aspect of his hand No obvious deformity Abrasion over his 3rd MCP joint, no bleeding. Sensation intact No right wrist, right shoulder or right elbow tenderness Good capillary refill  Lymphadenopathy:    He has no cervical adenopathy.  Neurological: He is alert. Coordination normal.  Skin sensation to his right distal fingertips.  Skin: Skin is warm and dry. No rash noted. He is not diaphoretic.  Psychiatric: He has a normal mood and affect. His behavior is normal.  Nursing note and vitals reviewed.   ED Course  Procedures   DIAGNOSTIC STUDIES: Oxygen Saturation is 99% on RA, normal by my interpretation.    COORDINATION OF  CARE: 9:25 PM Discussed treatment plan with pt which includes RICE, Naprosyn and Norco. Pt agreed to plan.   Labs Review Labs Reviewed - No data to display  Imaging Review Dg Hand Complete Right  09/24/2014   CLINICAL DATA:  Right hand pain and swelling after blunt trauma yesterday.  EXAM: RIGHT HAND - COMPLETE 3+ VIEW  COMPARISON:  None.  FINDINGS: Negative for acute fracture, dislocation or radiopaque foreign body. There is remote healed  fracture deformity of the fifth metacarpal.  IMPRESSION: Negative for acute fracture   Electronically Signed   By: Ellery Plunk M.D.   On: 09/24/2014 21:06     EKG Interpretation None      Filed Vitals:   09/24/14 2019  BP: 143/83  Pulse: 69  Temp: 98.2 F (36.8 C)  TempSrc: Oral  Resp: 16  SpO2: 99%     MDM   Meds given in ED:  Medications  ibuprofen (ADVIL,MOTRIN) tablet 800 mg (not administered)    New Prescriptions   HYDROCODONE-ACETAMINOPHEN (NORCO/VICODIN) 5-325 MG PER TABLET    Take 1 tablet by mouth every 4 (four) hours as needed for moderate pain or severe pain.   NAPROXEN (NAPROSYN) 250 MG TABLET    Take 1 tablet (250 mg total) by mouth 2 (two) times daily with a meal.    Final diagnoses:  Hand contusion, right, initial encounter    This is a 29 y.o. right hand dominant male who presents to the Emergency Department complaining of constant, 7/10, right hand pain that started yesterday. He states swelling of his hand and an abrasion to the knuckles of his 3rd and 4th fingers as associated symptoms. Pt has not tried any treatment PTA. Pt reports that the injury occurred while he was trying to jack his car up and the jack hit his hand. On exam the patient has moderate edema to the dorsal aspect of his right hand. There is a small abrasion by his third MCP joint. Bleeding is controlled. Patient's compartments feel soft. He denies any numbness or tingling. Sensation is intact. Good capillary refill. Patient has a contusion to his right hand. His Tdap is up-to-date. We will discharge with Ace wrap and prescriptions for naproxen and Norco for breakthrough pain. Encourage close follow-up with primary care or to return to the emergency department. I educated on signs of compartment syndrome and advised him to return immediately if he has any signs of compartment syndrome. I advised the patient to follow-up with their primary care provider this week. I advised the patient to  return to the emergency department with new or worsening symptoms or new concerns. The patient verbalized understanding and agreement with plan.    I personally performed the services described in this documentation, which was scribed in my presence. The recorded information has been reviewed and is accurate.      Everlene Farrier, PA-C 09/24/14 2151  Laurence Spates, MD 09/25/14 [phone]

## 2014-09-24 NOTE — Discharge Instructions (Signed)
Please use ice and elevation of your hand. Please watch for signs of compartment syndrome like we discussed. Return to the ED if you have symptoms like you can feel your fingers, have extreme pain, or your fingers turn purple for example.  Contusion A contusion is a deep bruise. Contusions are the result of an injury that caused bleeding under the skin. The contusion may turn blue, purple, or yellow. Minor injuries will give you a painless contusion, but more severe contusions may stay painful and swollen for a few weeks.  CAUSES  A contusion is usually caused by a blow, trauma, or direct force to an area of the body. SYMPTOMS   Swelling and redness of the injured area.  Bruising of the injured area.  Tenderness and soreness of the injured area.  Pain. DIAGNOSIS  The diagnosis can be made by taking a history and physical exam. An X-ray, CT scan, or MRI may be needed to determine if there were any associated injuries, such as fractures. TREATMENT  Specific treatment will depend on what area of the body was injured. In general, the best treatment for a contusion is resting, icing, elevating, and applying cold compresses to the injured area. Over-the-counter medicines may also be recommended for pain control. Ask your caregiver what the best treatment is for your contusion. HOME CARE INSTRUCTIONS   Put ice on the injured area.  Put ice in a plastic bag.  Place a towel between your skin and the bag.  Leave the ice on for 15-20 minutes, 3-4 times a day, or as directed by your health care provider.  Only take over-the-counter or prescription medicines for pain, discomfort, or fever as directed by your caregiver. Your caregiver may recommend avoiding anti-inflammatory medicines (aspirin, ibuprofen, and naproxen) for 48 hours because these medicines may increase bruising.  Rest the injured area.  If possible, elevate the injured area to reduce swelling. SEEK IMMEDIATE MEDICAL CARE IF:    You have increased bruising or swelling.  You have pain that is getting worse.  Your swelling or pain is not relieved with medicines. MAKE SURE YOU:   Understand these instructions.  Will watch your condition.  Will get help right away if you are not doing well or get worse. Document Released: 10/22/2004 Document Revised: 01/17/2013 Document Reviewed: 11/17/2010 [facility] Patient Information 2015 [place], [place]. This information is not intended to replace advice given to you by your health care provider. Make sure you discuss any questions you have with your health care provider. Information on Acute Compartment Syndrome  Watch for these symptoms.  Compartment syndrome is a painful condition that occurs when swelling and pressure build up in a body space (compartment) of the arms or legs. Groups of muscles, nerves, and blood vessels in the arms and legs are separated into various compartments. Each compartment is surrounded by tough layers of tissue called fascia. In compartment syndrome, pressure builds up within the layers of fascia and begins to push on the structures within that compartment.  In acute compartment syndrome, the pressure builds up suddenly, often as the result of an injury. This is a surgical emergency. When a muscle in the compartment moves, you may feel severe pain. If pressure continues to increase, it can block the flow of blood in the smallest blood vessels (capillaries). Then, the nerves and muscles in the compartment cannot get enough oxygen and nutrients (substances needed for survival). They will start to die within 4-8 hours. That is why the pressure needs to  be relieved immediately. Identifying the condition early and treating it quickly can prevent most problems. CAUSES  Various things can lead to compartment syndrome. Possible causes include:   Injury. Some injuries can cause swelling or bleeding in a compartment. This can lead to compartment syndrome.  Injuries that may cause this problem include:  Broken bones, especially the long bones of the arms and legs.  Crushing injuries.  Penetrating injuries, such as a knife wound that punctures the skin and tissue underneath.  Badly bruised muscles.  Poisonous bites, such as a snake bite.  Severe burns.  Blocked blood flow. This could result from:  A cast or bandage that is too tight.  A surgical procedure. Blood flow sometimes has to be stopped for a while during a surgery, usually with a tourniquet.  Lying for too long in a position that restricts blood flow. This can happen in people who have nerve damage or if a person is unconscious for a long time.  Drugs used to build up muscles (anabolic steroids).  Drugs that keep the blood from forming clots (blood thinners). SIGNS AND SYMPTOMS  The most common symptom of compartment syndrome is pain. The pain may:   Get worse when moving or stretching the affected body part.  Be more severe than it should be for an injury.  Come along with a feeling of tingling or burning.  Become worse when the area is pushed or squeezed.  Be unaffected by pain medicine. Other symptoms include:   A feeling of tightness or fullness in the affected area.   A loss of feeling.  Weakness in the area.  Loss of movement.  Skin becoming pale, tight, and shiny over the painful area.  DIAGNOSIS  Your health care provider may suspect the problem based on how you describe the pain. The diagnosis is made by using a special device that measures the pressure in the affected area. Blood tests, X-rays, or an ultrasound exam may be done to help rule out other problems.  TREATMENT  Compartment syndrome is a surgical emergency. It should be treated very quickly.   First-aid treatment is given first. This may include:  Promptly treating an injury.  Loosening or removing any cast, bandage, or external wrap that may be causing pain.  Raising the painful  arm or leg to the same level as the heart.  Giving oxygen.  Giving fluid through an IV access tube that is put into a vein in the hand or arm.  Surgery (fasciotomy) is needed to relieve the pressure and help prevent permanent damage. In this surgery, cuts (incisions) are made through the fascia to relieve the pressure in the compartment. Document Released: 12/31/2008 Document Revised: 09/14/2012 Document Reviewed: 08/16/2012 [facility] Patient Information 2015 [place], [place]. This information is not intended to replace advice given to you by your health care provider. Make sure you discuss any questions you have with your health care provider.

## 2016-07-18 IMAGING — DX DG HAND COMPLETE 3+V*R*
3 series · 3 of 3 positions shown · non-contrast
Comparison: None.

CLINICAL DATA: Right hand pain and swelling after blunt trauma
yesterday.

EXAM:
RIGHT HAND - COMPLETE 3+ VIEW

[x hand pa right]
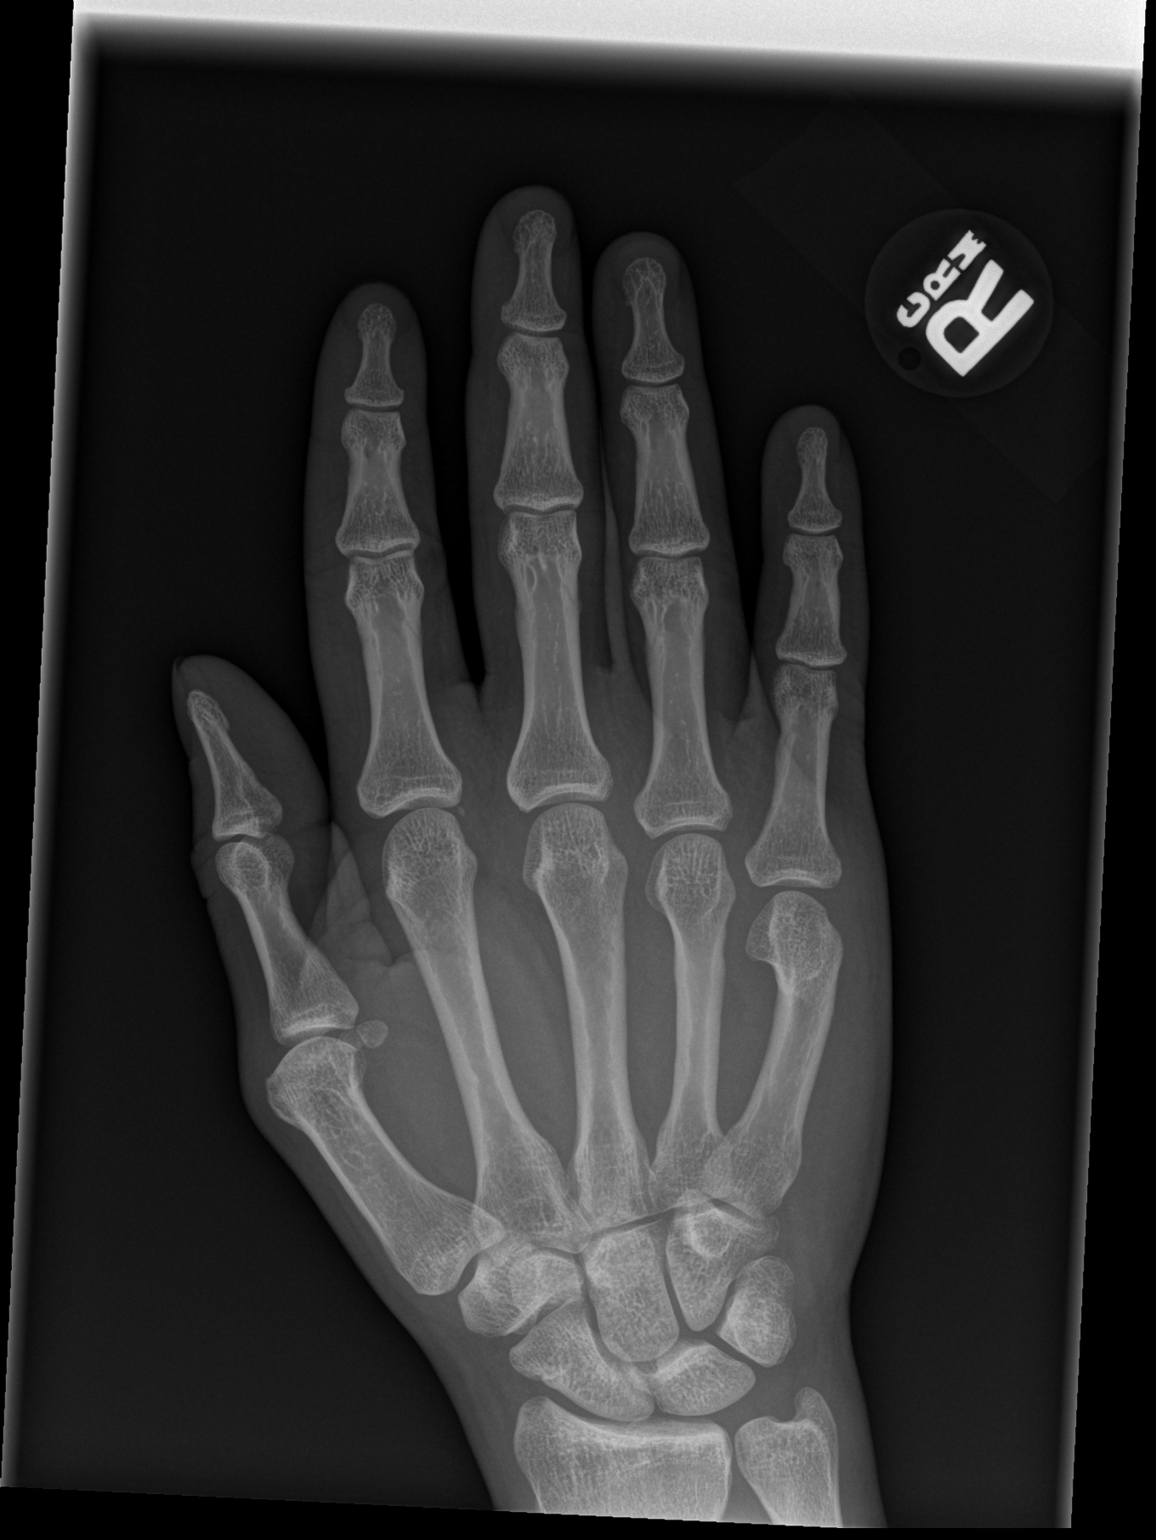

[x hand obl right]
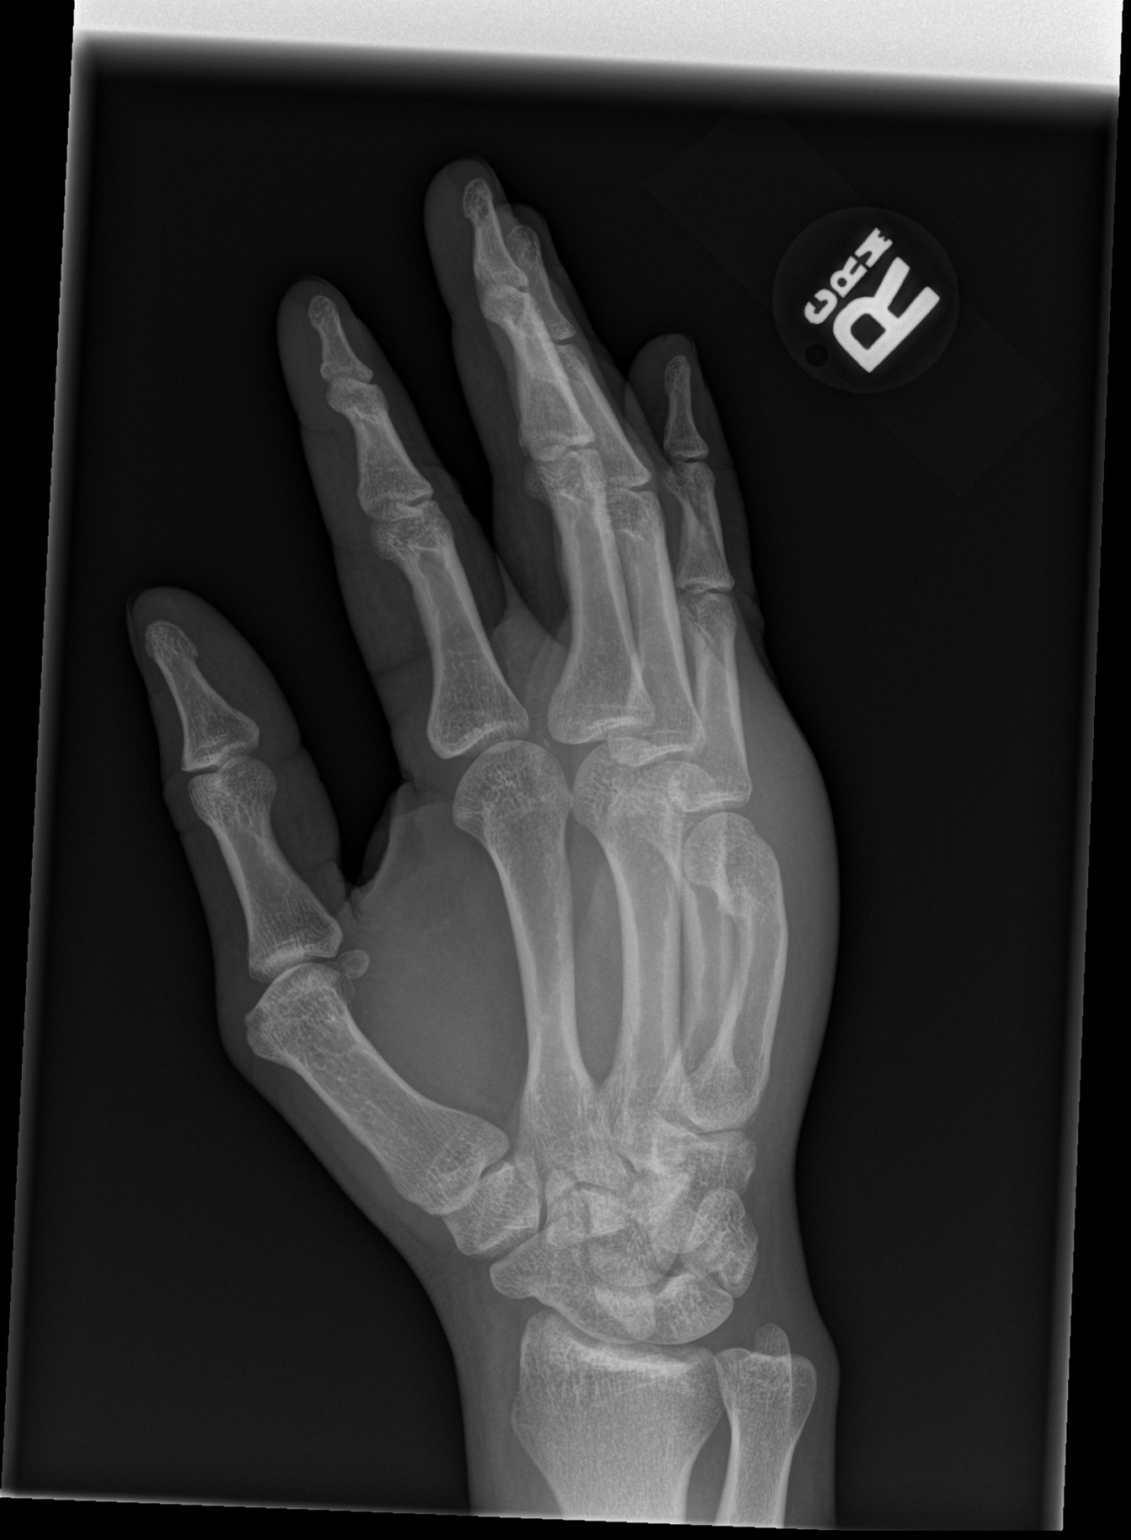

[x hand lat right]
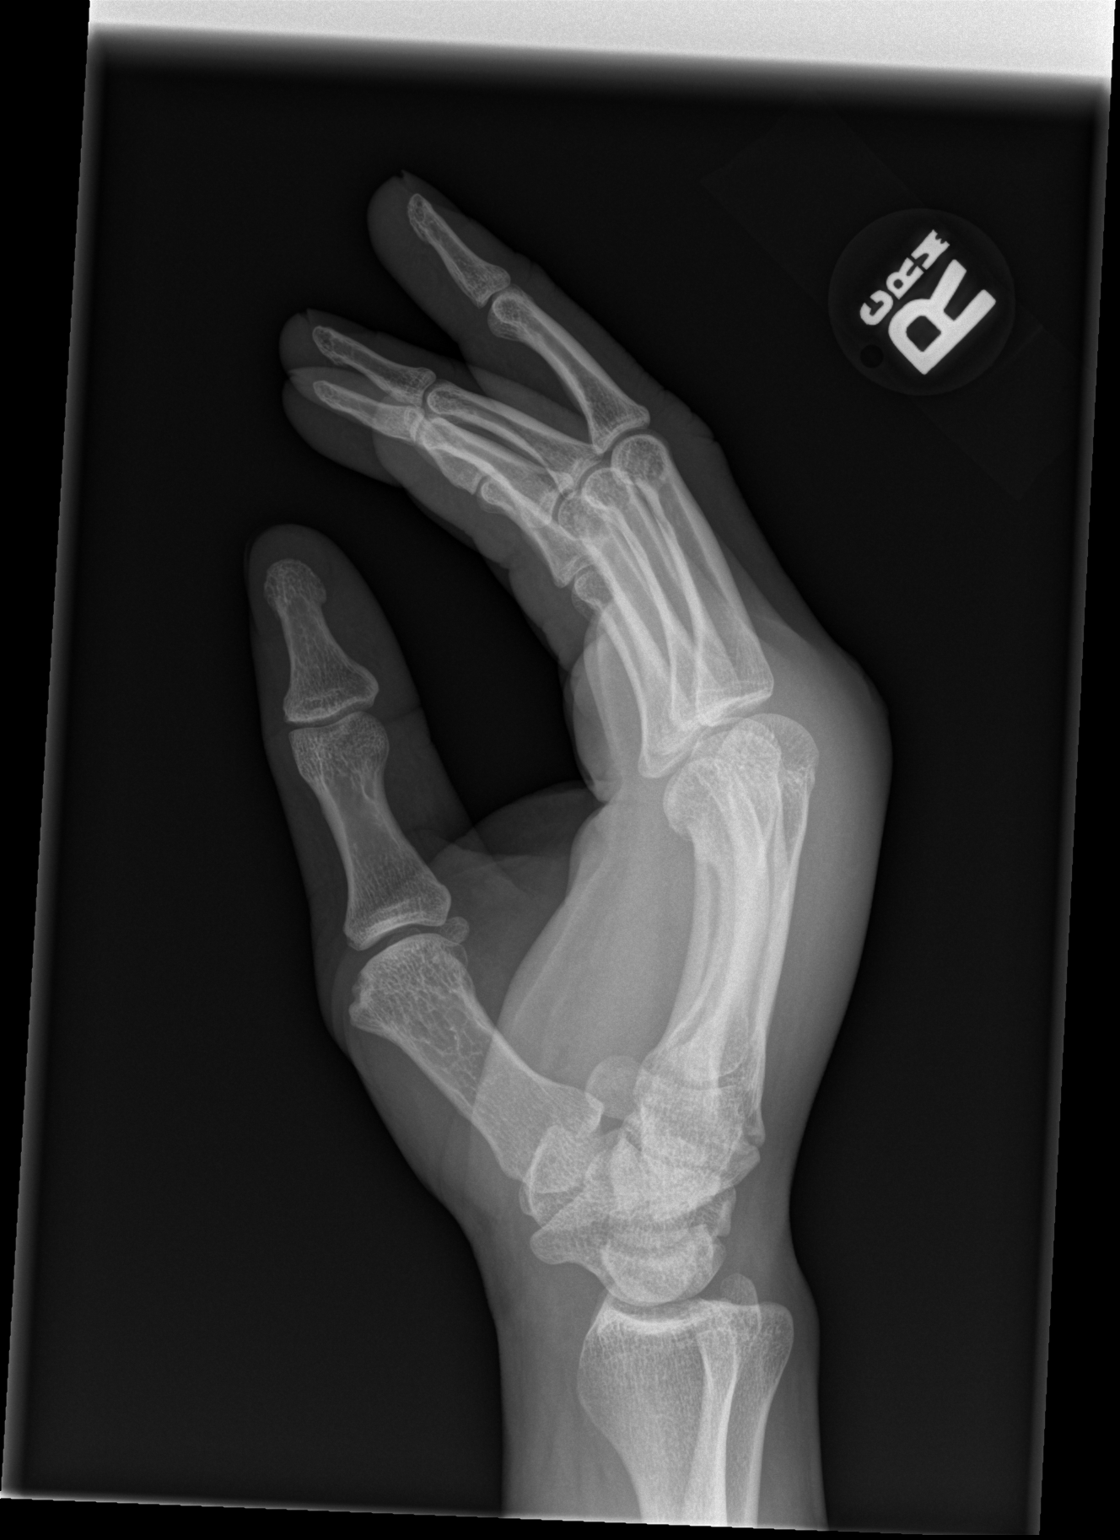

[3 of 3 positions shown; findings below may reference images not displayed]

FINDINGS: Negative for acute fracture, dislocation or radiopaque foreign body.
There is remote healed fracture deformity of the fifth metacarpal.
IMPRESSION: Negative for acute fracture

## 2017-10-04 ENCOUNTER — Emergency Department (HOSPITAL_COMMUNITY): Payer: Self-pay

## 2017-10-04 ENCOUNTER — Encounter (HOSPITAL_COMMUNITY): Payer: Self-pay | Admitting: Emergency Medicine

## 2017-10-04 ENCOUNTER — Other Ambulatory Visit: Payer: Self-pay

## 2017-10-04 ENCOUNTER — Emergency Department (HOSPITAL_COMMUNITY)
Admission: EM | Admit: 2017-10-04 | Discharge: 2017-10-04 | Disposition: A | Discharge status: Home / Self Care | Payer: Self-pay | Attending: Emergency Medicine | Admitting: Emergency Medicine

## 2017-10-04 DIAGNOSIS — Z5321 Procedure and treatment not carried out due to patient leaving prior to being seen by health care provider: Secondary | ICD-10-CM | POA: Insufficient documentation

## 2017-10-04 DIAGNOSIS — M79641 Pain in right hand: Secondary | ICD-10-CM | POA: Insufficient documentation
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
## 2017-10-05 ENCOUNTER — Encounter (HOSPITAL_COMMUNITY): Payer: Self-pay | Admitting: *Deleted

## 2017-10-05 ENCOUNTER — Other Ambulatory Visit: Payer: Self-pay

## 2017-10-05 ENCOUNTER — Emergency Department (HOSPITAL_COMMUNITY)
Admission: EM | Admit: 2017-10-05 | Discharge: 2017-10-05 | Disposition: A | Discharge status: Home / Self Care | Payer: Self-pay | Attending: Emergency Medicine | Admitting: Emergency Medicine

## 2017-10-05 DIAGNOSIS — Y929 Unspecified place or not applicable: Secondary | ICD-10-CM | POA: Insufficient documentation

## 2017-10-05 DIAGNOSIS — I10 Essential (primary) hypertension: Secondary | ICD-10-CM | POA: Insufficient documentation

## 2017-10-05 DIAGNOSIS — S62364A Nondisplaced fracture of neck of fourth metacarpal bone, right hand, initial encounter for closed fracture: Secondary | ICD-10-CM | POA: Insufficient documentation

## 2017-10-05 DIAGNOSIS — W228XXA Striking against or struck by other objects, initial encounter: Secondary | ICD-10-CM | POA: Insufficient documentation

## 2017-10-05 DIAGNOSIS — Z79899 Other long term (current) drug therapy: Secondary | ICD-10-CM | POA: Insufficient documentation

## 2017-10-05 DIAGNOSIS — Y999 Unspecified external cause status: Secondary | ICD-10-CM | POA: Insufficient documentation

## 2017-10-05 DIAGNOSIS — Y9389 Activity, other specified: Secondary | ICD-10-CM | POA: Insufficient documentation

## 2017-10-05 MED ORDER — HYDROCODONE-ACETAMINOPHEN 5-325 MG PO TABS: 1.0000 | ORAL_TABLET | Freq: Four times a day (QID) | ORAL | 0 refills | Status: DC | PRN

## 2017-10-05 NOTE — ED Notes (Signed)
Ortho bedside

## 2017-10-05 NOTE — ED Notes (Signed)
Ortho paged for ulnar gutter splint 

## 2017-10-05 NOTE — ED Notes (Signed)
Patient verbalizes understanding of discharge instructions. Opportunity for questioning and answers were provided. Armband removed by staff, pt discharged from ED ambulatory.   

## 2017-10-05 NOTE — Discharge Instructions (Signed)
Take ibuprofen 3 times a day with meals. Take 800 mg (4 pills) at a time.  Do not take other anti-inflammatories at the same time (Advil, Motrin, naproxen, Aleve).  Use norco as needed for severe or breakthrough pain.  Follow up with the hand doctors listed below.  Return to the ER if you have new numbness of your fingers, your fingers turn purple or white, or with any new or concerning symptoms.
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
## 2017-10-05 NOTE — ED Notes (Signed)
Ortho in to do ulna gutter

## 2017-10-05 NOTE — Progress Notes (Signed)
Orthopedic Tech Progress Note Patient Details:  Carlos Meyers Nov 08, 1985 [id number]  Ortho Devices Type of Ortho Device: Ace wrap, Arm sling, Ulna gutter splint Ortho Device/Splint Location: rue Ortho Device/Splint Interventions: Application   Post Interventions Patient Tolerated: Well Instructions Provided: Care of device   Nikki Dom 10/05/2017, 12:08 PM
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
## 2017-10-26 ENCOUNTER — Emergency Department (HOSPITAL_COMMUNITY)
Admission: EM | Admit: 2017-10-26 | Discharge: 2017-10-26 | Disposition: A | Discharge status: Home / Self Care | Payer: Self-pay | Attending: Emergency Medicine | Admitting: Emergency Medicine

## 2017-10-26 ENCOUNTER — Encounter (HOSPITAL_COMMUNITY): Payer: Self-pay

## 2017-10-26 DIAGNOSIS — M542 Cervicalgia: Secondary | ICD-10-CM | POA: Insufficient documentation

## 2017-10-26 DIAGNOSIS — Z5321 Procedure and treatment not carried out due to patient leaving prior to being seen by health care provider: Secondary | ICD-10-CM | POA: Insufficient documentation

## 2017-10-26 NOTE — ED Notes (Signed)
Patient walked out of room with family getting to leave. This RN stopped and asked patient if he was leaving. Patient states "yes, I feel fine I'm ready to go home. If I need to come back I will go to another hospital". This RN informed the patient the provider was getting ready to go into his room after speaking with another patient. The patient again stated "I'm fine and would just like to go home".   The provider talked with the patient who again stated he wanted to leave. Patient ambulatory at discharge.
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
## 2018-01-29 ENCOUNTER — Emergency Department (HOSPITAL_COMMUNITY): Payer: Self-pay

## 2018-01-29 ENCOUNTER — Emergency Department (HOSPITAL_COMMUNITY)
Admission: EM | Admit: 2018-01-29 | Discharge: 2018-01-29 | Disposition: A | Discharge status: Home / Self Care | Payer: Self-pay | Attending: Emergency Medicine | Admitting: Emergency Medicine

## 2018-01-29 ENCOUNTER — Encounter (HOSPITAL_COMMUNITY): Payer: Self-pay | Admitting: Emergency Medicine

## 2018-01-29 DIAGNOSIS — M25512 Pain in left shoulder: Secondary | ICD-10-CM | POA: Insufficient documentation

## 2018-01-29 DIAGNOSIS — H109 Unspecified conjunctivitis: Secondary | ICD-10-CM | POA: Insufficient documentation

## 2018-01-29 MED ORDER — TOBRAMYCIN 0.3 % OP SOLN: 1.0000 [drp] | OPHTHALMIC | 0 refills | Status: DC

## 2018-01-29 MED ORDER — DICLOFENAC SODIUM 75 MG PO TBEC: 75.0000 mg | DELAYED_RELEASE_TABLET | Freq: Two times a day (BID) | ORAL | 0 refills | Status: DC

## 2018-01-29 NOTE — ED Provider Notes (Signed)
[facility] EMERGENCY DEPARTMENT Provider Note   CSN: [id number] Arrival date & time: 01/29/18  1216     History   Chief Complaint Chief Complaint  Patient presents with  . Shoulder Pain  . Eye Problem    HPI Carlos Meyers is a 33 y.o. male.  The history is provided by the patient. No language interpreter was used.  Shoulder Pain   This is a new problem. The current episode started more than 2 days ago. The problem occurs constantly. The problem has been gradually worsening. The pain is present in the left shoulder. The quality of the pain is described as aching. The pain is moderate. Pertinent negatives include no numbness. He has tried nothing for the symptoms. The treatment provided no relief.  Eye Problem   Pertinent negatives include no numbness.  Pt reports he hit shoulder 4 days ago.  Pt complains of both eyes draining.  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 Pt reports yes are irritated.   Past Medical History:  Diagnosis Date  . Hypertension     Patient Active Problem List   Diagnosis Date Noted  . Facial cellulitis 12/18/2012  . Maxillary fracture (HCC) 12/18/2012    History reviewed. No pertinent surgical history.      Home Medications    Prior to Admission medications   Medication Sig Start Date End Date Taking? Authorizing Provider  HYDROcodone-acetaminophen (NORCO/VICODIN) 5-325 MG tablet Take 1 tablet by mouth every 6 (six) hours as needed for up to 6 doses for moderate pain or severe pain. 10/05/17   Caccavale, Sophia, PA-C  naproxen (NAPROSYN) 250 MG tablet Take 1 tablet (250 mg total) by mouth 2 (two) times daily with a meal. 09/24/14   Everlene Farrieransie, William, PA-C    Family History History reviewed. No pertinent family history.  Social History Social History   Tobacco Use  . Smoking status: Never Smoker  . Smokeless tobacco: Never Used  Substance Use Topics  . Alcohol use: Not Currently    Alcohol/week: 2.0 standard drinks      Types: 2 Cans of beer per week  . Drug use: No     Allergies   Patient has no known allergies.   Review of Systems Review of Systems  Neurological: Negative for numbness.  All other systems reviewed and are negative.    Physical Exam Updated Vital Signs BP 134/84 (BP Location: Right Arm)   Pulse 85   Temp 98.3 F (36.8 C) (Oral)   Resp 15   Ht 5\' 6"  (1.676 m)   Wt 74.8 kg   SpO2 99%   BMI 26.63 kg/m   Physical Exam Vitals signs and nursing note reviewed.  Constitutional:      Appearance: He is well-developed.  HENT:     Head: Normocephalic.  Eyes:     Extraocular Movements: Extraocular movements intact.     Pupils: Pupils are equal, round, and reactive to light.     Comments: Injected bilat conjunctiva   Neck:     Musculoskeletal: Normal range of motion.  Cardiovascular:     Rate and Rhythm: Normal rate.  Pulmonary:     Effort: Pulmonary effort is normal.  Abdominal:     General: There is no distension.  Musculoskeletal:        General: Swelling present.     Comments: Tender shoulder, pain with movement, nv and ns intact   Skin:    General: Skin is warm.  Neurological:  General: No focal deficit present.     Mental Status: He is alert and oriented to person, place, and time.  Psychiatric:        Mood and Affect: Mood normal.      ED Treatments / Results  Labs (all labs ordered are listed, but only abnormal results are displayed) Labs Reviewed - No data to display  EKG None  Radiology Dg Shoulder Left  Result Date: 01/29/2018 CLINICAL DATA:  Fall 4 days ago with left shoulder pain. EXAM: LEFT SHOULDER - 2+ VIEW COMPARISON:  None. FINDINGS: There is no evidence of fracture or dislocation. There is no evidence of arthropathy or other focal bone abnormality. Soft tissues are unremarkable. IMPRESSION: Negative. Electronically Signed   By: Elberta Fortis M.D.   On: 01/29/2018 14:01    Procedures Procedures (including critical care  time)  Medications Ordered in ED Medications - No data to display   Initial Impression / Assessment and Plan / ED Course  I have reviewed the triage vital signs and the nursing notes.  Pertinent labs & imaging results that were available during my care of the patient were reviewed by me and considered in my medical decision making (see chart for details).       Final Clinical Impressions(s) / ED Diagnoses   Final diagnoses:  Conjunctivitis of both eyes, unspecified conjunctivitis type  Acute pain of left shoulder    ED Discharge Orders         Ordered    tobramycin (TOBREX) 0.3 % ophthalmic solution  Every 4 hours     01/29/18 1511    diclofenac (VOLTAREN) 75 MG EC tablet  2 times daily     01/29/18 1511           Osie Cheeks 01/29/18 1511    Tilden Fossa, MD 01/31/18 [phone]
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
## 2018-07-30 ENCOUNTER — Emergency Department (HOSPITAL_COMMUNITY)
Admission: EM | Admit: 2018-07-30 | Discharge: 2018-07-30 | Disposition: A | Discharge status: Home / Self Care | Payer: Self-pay | Attending: Emergency Medicine | Admitting: Emergency Medicine

## 2018-07-30 ENCOUNTER — Other Ambulatory Visit: Payer: Self-pay

## 2018-07-30 ENCOUNTER — Encounter (HOSPITAL_COMMUNITY): Payer: Self-pay | Admitting: Emergency Medicine

## 2018-07-30 DIAGNOSIS — Y999 Unspecified external cause status: Secondary | ICD-10-CM | POA: Insufficient documentation

## 2018-07-30 DIAGNOSIS — Y929 Unspecified place or not applicable: Secondary | ICD-10-CM | POA: Insufficient documentation

## 2018-07-30 DIAGNOSIS — S80869A Insect bite (nonvenomous), unspecified lower leg, initial encounter: Secondary | ICD-10-CM | POA: Insufficient documentation

## 2018-07-30 DIAGNOSIS — W57XXXA Bitten or stung by nonvenomous insect and other nonvenomous arthropods, initial encounter: Secondary | ICD-10-CM | POA: Insufficient documentation

## 2018-07-30 DIAGNOSIS — Z79899 Other long term (current) drug therapy: Secondary | ICD-10-CM | POA: Insufficient documentation

## 2018-07-30 DIAGNOSIS — Y939 Activity, unspecified: Secondary | ICD-10-CM | POA: Insufficient documentation

## 2018-07-30 DIAGNOSIS — I10 Essential (primary) hypertension: Secondary | ICD-10-CM | POA: Insufficient documentation

## 2018-07-30 NOTE — ED Triage Notes (Signed)
Pt reports he pulled a tick off his left leg x 2 days ago. C/o itching at the site. Denies weakness/fatigue, nausea/vomiting.

## 2018-07-30 NOTE — ED Provider Notes (Signed)
[facility] EMERGENCY DEPARTMENT Provider Note   CSN: [id number] Arrival date & time: 07/30/18  2105     History   Chief Complaint Chief Complaint  Patient presents with  . Insect Bite    HPI Carlos Meyers is a 33 y.o. male.     Patient to ED with complaint of itching to site where he pulled a tick from his leg 2 days ago. No rash, fever, vomiting, headache. No drainage from or redness of the area.   The history is provided by the patient. No language interpreter was used.    Past Medical History:  Diagnosis Date  . Hypertension     Patient Active Problem List   Diagnosis Date Noted  . Facial cellulitis 12/18/2012  . Maxillary fracture ([place]) 12/18/2012    History reviewed. No pertinent surgical history.      Home Medications    Prior to Admission medications   Medication Sig Start Date End Date Taking? Authorizing Provider  diclofenac (VOLTAREN) 75 MG EC tablet Take 1 tablet (75 mg total) by mouth 2 (two) times daily. 01/29/18   Fransico Meadow, PA-C  HYDROcodone-acetaminophen (NORCO/VICODIN) 5-325 MG tablet Take 1 tablet by mouth every 6 (six) hours as needed for up to 6 doses for moderate pain or severe pain. 10/05/17   Caccavale, Sophia, PA-C  naproxen (NAPROSYN) 250 MG tablet Take 1 tablet (250 mg total) by mouth 2 (two) times daily with a meal. 09/24/14   Waynetta Pean, PA-C  tobramycin (TOBREX) 0.3 % ophthalmic solution Place 1 drop into both eyes every 4 (four) hours. 01/29/18   Fransico Meadow, PA-C    Family History No family history on file.  Social History Social History   Tobacco Use  . Smoking status: Never Smoker  . Smokeless tobacco: Never Used  Substance Use Topics  . Alcohol use: Not Currently    Alcohol/week: 2.0 standard drinks    Types: 2 Cans of beer per week  . Drug use: No     Allergies   Patient has no known allergies.   Review of Systems Review of Systems  Constitutional: Negative for chills and fever.   Gastrointestinal: Negative for vomiting.  Musculoskeletal: Negative for myalgias.  Skin: Negative for rash.       See HPI.  Neurological: Negative for headaches.     Physical Exam Updated Vital Signs BP (!) 135/92   Pulse (!) 107   Temp 97.7 F (36.5 C) (Oral)   Resp 16   SpO2 98%   Physical Exam Constitutional:      Appearance: He is well-developed.  Neck:     Musculoskeletal: Normal range of motion.  Pulmonary:     Effort: Pulmonary effort is normal.  Musculoskeletal: Normal range of motion.  Skin:    General: Skin is warm and dry.     Comments: Solitary, scabbed lesion to anterior distal lower extremity. No surrounding erythema, rash, swelling or drainage from the wound.   Neurological:     Mental Status: He is alert and oriented to person, place, and time.      ED Treatments / Results  Labs (all labs ordered are listed, but only abnormal results are displayed) Labs Reviewed - No data to display  EKG None  Radiology No results found.  Procedures Procedures (including critical care time)  Medications Ordered in ED Medications - No data to display   Initial Impression / Assessment and Plan / ED Course  I have reviewed the  triage vital signs and the nursing notes.  Pertinent labs & imaging results that were available during my care of the patient were reviewed by me and considered in my medical decision making (see chart for details).        Patient to ED for itching at site of tick bite 2 days ago. No rash or systemic illness. Recommended basic wound care. No concern for RMSF, [place] or secondary infection.   Final Clinical Impressions(s) / ED Diagnoses   Final diagnoses:  None   1. Tick bite  ED Discharge Orders    None       Elpidio AnisUpstill, Darran Gabay, PA-C 08/06/18 0452    Virgina Norfolkuratolo, Adam, DO 08/10/18 [phone]

## 2018-07-30 NOTE — Discharge Instructions (Addendum)
Follow up with your doctor as needed.  Return to the emergency department if you develop a rash at the site of the bite, fever, headache, vomiting or new concern.

## 2018-07-30 NOTE — ED Notes (Addendum)
Patient verbalizes understanding of discharge instructions by EDP. Opportunity for questioning and answers were provided. Armband removed by staff, pt discharged from ED

## 2018-08-06 ENCOUNTER — Emergency Department (HOSPITAL_COMMUNITY)
Admission: EM | Admit: 2018-08-06 | Discharge: 2018-08-07 | Disposition: A | Discharge status: Home / Self Care | Payer: Self-pay | Attending: Emergency Medicine | Admitting: Emergency Medicine

## 2018-08-06 DIAGNOSIS — R079 Chest pain, unspecified: Secondary | ICD-10-CM | POA: Insufficient documentation

## 2018-08-06 DIAGNOSIS — Z5321 Procedure and treatment not carried out due to patient leaving prior to being seen by health care provider: Secondary | ICD-10-CM | POA: Insufficient documentation

## 2018-08-06 NOTE — ED Triage Notes (Signed)
Pt initially reports his "blood pressure in high" informed pt of his blood pressure. Pt now reports his chest was hurting but no longer is. Ankle monitor noted to L ankle (pt requested I speak to his probation officer to tell him where pt was located). Pt also noted to be arguing with someone on his cellphone during triage.

## 2018-08-07 NOTE — ED Notes (Signed)
Unable to locate pt in the lobby  

## 2018-09-16 ENCOUNTER — Encounter (HOSPITAL_COMMUNITY): Payer: Self-pay | Admitting: Emergency Medicine

## 2018-09-16 ENCOUNTER — Other Ambulatory Visit: Payer: Self-pay

## 2018-09-16 ENCOUNTER — Emergency Department (HOSPITAL_COMMUNITY)
Admission: EM | Admit: 2018-09-16 | Discharge: 2018-09-16 | Disposition: A | Discharge status: Home / Self Care | Attending: Emergency Medicine | Admitting: Emergency Medicine

## 2018-09-16 DIAGNOSIS — Z79899 Other long term (current) drug therapy: Secondary | ICD-10-CM | POA: Insufficient documentation

## 2018-09-16 DIAGNOSIS — I10 Essential (primary) hypertension: Secondary | ICD-10-CM | POA: Diagnosis not present

## 2018-09-16 DIAGNOSIS — L03211 Cellulitis of face: Secondary | ICD-10-CM

## 2018-09-16 DIAGNOSIS — R220 Localized swelling, mass and lump, head: Secondary | ICD-10-CM | POA: Diagnosis present

## 2018-09-16 MED ORDER — LIDOCAINE HCL (PF) 1 % IJ SOLN
INTRAMUSCULAR | Status: AC
  Administered 2018-09-16: 18:00:00
  Filled 2018-09-16: qty 2

## 2018-09-16 MED ORDER — CEFTRIAXONE SODIUM 1 G IJ SOLR
1.0000 g | Freq: Once | INTRAMUSCULAR | Status: AC
  Administered 2018-09-16: 1 g via INTRAMUSCULAR
  Filled 2018-09-16: qty 10

## 2018-09-16 MED ORDER — DOXYCYCLINE HYCLATE 100 MG PO TABS
100.0000 mg | ORAL_TABLET | Freq: Once | ORAL | Status: AC
  Administered 2018-09-16: 100 mg via ORAL
  Filled 2018-09-16: qty 1

## 2018-09-16 MED ORDER — DOXYCYCLINE HYCLATE 100 MG PO CAPS: 100.0000 mg | ORAL_CAPSULE | Freq: Two times a day (BID) | ORAL | 0 refills | Status: DC

## 2018-09-16 MED ORDER — IBUPROFEN 800 MG PO TABS: 800.0000 mg | ORAL_TABLET | Freq: Three times a day (TID) | ORAL | 0 refills | Status: DC

## 2018-09-16 NOTE — Discharge Instructions (Signed)
Return if any problems.  Warm compresses 20 minutes 4 times a day 

## 2018-09-16 NOTE — ED Triage Notes (Signed)
Pt here for evaluation of RT sided facial swelling x 2 days. Pt states he initially had a small bump, tried to pop it, and a couple of days later the swelling began.

## 2018-09-16 NOTE — ED Provider Notes (Signed)
[facility] EMERGENCY DEPARTMENT Provider Note   CSN: [id number] Arrival date & time: 09/16/18  1643     History   Chief Complaint Chief Complaint  Patient presents with  . Facial Swelling    HPI Carlos Meyers is a 33 y.o. male.     Pt complains of pain in the right side of his face. Pt reports he popped a pimple and now his face is swollen.  Pt complains of pain and swelling. Pt is currently in jail.  He is here in custody.  Pt denies fever or chills   The history is provided by the patient. No language interpreter was used.    Past Medical History:  Diagnosis Date  . Hypertension     Patient Active Problem List   Diagnosis Date Noted  . Facial cellulitis 12/18/2012  . Maxillary fracture ([place]) 12/18/2012    History reviewed. No pertinent surgical history.      Home Medications    Prior to Admission medications   Medication Sig Start Date End Date Taking? Authorizing Provider  diclofenac (VOLTAREN) 75 MG EC tablet Take 1 tablet (75 mg total) by mouth 2 (two) times daily. 01/29/18   Fransico Meadow, PA-C  doxycycline (VIBRAMYCIN) 100 MG capsule Take 1 capsule (100 mg total) by mouth 2 (two) times daily. 09/16/18   Fransico Meadow, PA-C  HYDROcodone-acetaminophen (NORCO/VICODIN) 5-325 MG tablet Take 1 tablet by mouth every 6 (six) hours as needed for up to 6 doses for moderate pain or severe pain. 10/05/17   Caccavale, Sophia, PA-C  ibuprofen (ADVIL) 800 MG tablet Take 1 tablet (800 mg total) by mouth 3 (three) times daily. 09/16/18   Fransico Meadow, PA-C  naproxen (NAPROSYN) 250 MG tablet Take 1 tablet (250 mg total) by mouth 2 (two) times daily with a meal. 09/24/14   Waynetta Pean, PA-C  tobramycin (TOBREX) 0.3 % ophthalmic solution Place 1 drop into both eyes every 4 (four) hours. 01/29/18   Fransico Meadow, PA-C    Family History No family history on file.  Social History Social History   Tobacco Use  . Smoking status: Never Smoker  . Smokeless tobacco:  Never Used  Substance Use Topics  . Alcohol use: Not Currently    Alcohol/week: 2.0 standard drinks    Types: 2 Cans of beer per week  . Drug use: No     Allergies   Patient has no known allergies.   Review of Systems Review of Systems  All other systems reviewed and are negative.    Physical Exam Updated Vital Signs BP (!) 142/95 (BP Location: Right Arm)   Pulse 71   Temp 98.4 F (36.9 C) (Oral)   Resp 17   Ht 5\' 6"  (1.676 m)   Wt 70.3 kg   SpO2 99%   BMI 25.02 kg/m   Physical Exam Vitals signs reviewed.  HENT:     Head: Normocephalic.     Comments: Face swollen tender below eye,  No involvement of eye or lids  Cardiovascular:     Rate and Rhythm: Normal rate.  Musculoskeletal: Normal range of motion.  Skin:    General: Skin is warm.  Neurological:     General: No focal deficit present.     Mental Status: He is alert.  Psychiatric:        Mood and Affect: Mood normal.      ED Treatments / Results  Labs (all labs ordered are listed, but only abnormal results  are displayed) Labs Reviewed - No data to display  EKG None  Radiology No results found.  Procedures Procedures (including critical care time)  Medications Ordered in ED Medications  cefTRIAXone (ROCEPHIN) injection 1 g (1 g Intramuscular Given 09/16/18 1820)  doxycycline (VIBRA-TABS) tablet 100 mg (100 mg Oral Given 09/16/18 1820)  lidocaine (PF) (XYLOCAINE) 1 % injection (  Given 09/16/18 1821)     Initial Impression / Assessment and Plan / ED Course  I have reviewed the triage vital signs and the nursing notes.  Pertinent labs & imaging results that were available during my care of the patient were reviewed by me and considered in my medical decision making (see chart for details).        MDM  Pt given injection of Rocephin and doxycycline here.    Final Clinical Impressions(s) / ED Diagnoses   Final diagnoses:  Cellulitis, face    ED Discharge Orders         Ordered     doxycycline (VIBRAMYCIN) 100 MG capsule  2 times daily,   Status:  Discontinued     09/16/18 1804    ibuprofen (ADVIL) 800 MG tablet  3 times daily,   Status:  Discontinued     09/16/18 1804    ibuprofen (ADVIL) 800 MG tablet  3 times daily     09/16/18 1825    doxycycline (VIBRAMYCIN) 100 MG capsule  2 times daily     09/16/18 1825        An After Visit Summary was printed and given to the patient.    Elson AreasSofia,  K, PA-C 09/16/18 1854    Samuel JesterMcManus, Kathleen, DO 09/22/18 0830

## 2019-03-20 ENCOUNTER — Other Ambulatory Visit: Payer: Self-pay

## 2019-03-20 ENCOUNTER — Encounter (HOSPITAL_COMMUNITY): Payer: Self-pay

## 2019-03-20 ENCOUNTER — Ambulatory Visit (HOSPITAL_COMMUNITY)
Admission: EM | Admit: 2019-03-20 | Discharge: 2019-03-20 | Disposition: A | Discharge status: Home / Self Care | Attending: Family Medicine | Admitting: Family Medicine

## 2019-03-20 DIAGNOSIS — Z889 Allergy status to unspecified drugs, medicaments and biological substances status: Secondary | ICD-10-CM | POA: Diagnosis not present

## 2019-03-20 MED ORDER — PREDNISONE 20 MG PO TABS: 20.0000 mg | ORAL_TABLET | Freq: Two times a day (BID) | ORAL | 0 refills | Status: DC

## 2019-03-20 MED ORDER — TRIAMCINOLONE ACETONIDE 0.1 % EX OINT: 1.0000 "application " | TOPICAL_OINTMENT | Freq: Two times a day (BID) | CUTANEOUS | 1 refills | Status: DC

## 2019-03-20 NOTE — ED Provider Notes (Signed)
[facility]    CSN: [id number] Arrival date & time: 03/20/19  1800      History   Chief Complaint Chief Complaint  Patient presents with  . Tick Removal    HPI Carlos Meyers is a 34 y.o. male.   HPI  Patient states he had a tick bite in July 2020.  He works as a Development worker, international aid. He states he removed the tick but is not sure he got all of the tick out.  He was seen in the emergency room. He states that it looks like a "hole in my leg" Lately it has been itching and bothering him.  He has been putting Neosporin on it and a Band-Aid.  He has been putting Neosporin on it daily. Where he has had it bandaged, there is now a blistering rash There is also a rash on his right hand where the antibiotic ointment is gotten on his fingers  Past Medical History:  Diagnosis Date  . Hypertension     Patient Active Problem List   Diagnosis Date Noted  . Facial cellulitis 12/18/2012  . Maxillary fracture ([place]) 12/18/2012    History reviewed. No pertinent surgical history.     Home Medications    Prior to Admission medications   Medication Sig Start Date End Date Taking? Authorizing Provider  predniSONE (DELTASONE) 20 MG tablet Take 1 tablet (20 mg total) by mouth 2 (two) times daily with a meal. 03/20/19   Raylene Everts, MD  triamcinolone ointment (KENALOG) 0.1 % Apply 1 application topically 2 (two) times daily. 03/20/19   Raylene Everts, MD    Family History History reviewed. No pertinent family history.  Social History Social History   Tobacco Use  . Smoking status: Never Smoker  . Smokeless tobacco: Never Used  Substance Use Topics  . Alcohol use: Not Currently    Alcohol/week: 2.0 standard drinks    Types: 2 Cans of beer per week  . Drug use: No     Allergies   Neomycin   Review of Systems Review of Systems  Skin: Positive for rash.     Physical Exam Triage Vital Signs ED Triage Vitals  Enc Vitals Group     BP 03/20/19 1823 (!)  141/81     Pulse Rate 03/20/19 1823 71     Resp 03/20/19 1823 17     Temp 03/20/19 1823 98.7 F (37.1 C)     Temp Source 03/20/19 1823 Oral     SpO2 03/20/19 1823 97 %     Weight --      Height --      Head Circumference --      Peak Flow --      Pain Score 03/20/19 1821 6     Pain Loc --      Pain Edu? --      Excl. in [place]? --    No data found.  Updated Vital Signs BP (!) 141/81 (BP Location: Right Arm)   Pulse 71   Temp 98.7 F (37.1 C) (Oral)   Resp 17   SpO2 97%      Physical Exam Constitutional:      General: He is not in acute distress.    Appearance: Normal appearance. He is well-developed.     Comments: observation  HENT:     Head: Normocephalic and atraumatic.     Mouth/Throat:     Comments: mask Eyes:     Conjunctiva/sclera: Conjunctivae normal.  Pupils: Pupils are equal, round, and reactive to light.  Cardiovascular:     Rate and Rhythm: Normal rate.  Pulmonary:     Effort: Pulmonary effort is normal. No respiratory distress.  Abdominal:     General: There is no distension.     Palpations: Abdomen is soft.  Musculoskeletal:        General: Normal range of motion.     Cervical back: Normal range of motion.  Skin:    General: Skin is warm and dry.     Comments: Left calf with a rectangular vesicular rash correspondent to the size and shape of the bandage removed  Neurological:     Mental Status: He is alert.  Psychiatric:        Mood and Affect: Mood normal.        Behavior: Behavior normal.      UC Treatments / Results  Labs (all labs ordered are listed, but only abnormal results are displayed) Labs Reviewed - No data to display  EKG   Radiology No results found.  Procedures Procedures (including critical care time)  Medications Ordered in UC Medications - No data to display  Initial Impression / Assessment and Plan / UC Course  I have reviewed the triage vital signs and the nursing notes.  Pertinent labs & imaging results  that were available during my care of the patient were reviewed by me and considered in my medical decision making (see chart for details).     Allergic to Neosporin.  Likely to neomycin.  He is told not to use this anymore.  He also has some rash on his fingers.  I will treat with steroids.  He needs to let this cool down.  When the skin is completely healed, if there is still an area around the bite that worries him, he can come back and have this rechecked. Final Clinical Impressions(s) / UC Diagnoses   Final diagnoses:  Personal history of allergy to medicinal agent     Discharge Instructions     STOP THE NEOSPORIN - YOU ARE ALLERGIC  Take prednisone 2 times a day for 5 days Wash area, pat dry, then Use the triamcinolone ointment 2 times a day Return when this is completely cleared up for reevaluation of the tick bite     ED Prescriptions    Medication Sig Dispense Auth. Provider   triamcinolone ointment (KENALOG) 0.1 % Apply 1 application topically 2 (two) times daily. 15 g Eustace Moore, MD   predniSONE (DELTASONE) 20 MG tablet Take 1 tablet (20 mg total) by mouth 2 (two) times daily with a meal. 10 tablet Eustace Moore, MD     PDMP not reviewed this encounter.   Eustace Moore, MD 03/20/19 [phone]

## 2019-03-20 NOTE — ED Triage Notes (Signed)
Pt reports he was bite by a tick 6 months ago in the left leg. Pt reports he removed the tick, but is not sure if the whole tick came out. Pt reports the are were he was bit is swelling and burning sensation in the past 3 days.

## 2019-03-20 NOTE — Discharge Instructions (Addendum)
STOP THE NEOSPORIN - YOU ARE ALLERGIC  Take prednisone 2 times a day for 5 days Wash area, pat dry, then Use the triamcinolone ointment 2 times a day Return when this is completely cleared up for reevaluation of the tick bite

## 2019-04-06 ENCOUNTER — Encounter (HOSPITAL_COMMUNITY): Payer: Self-pay

## 2019-04-06 ENCOUNTER — Other Ambulatory Visit: Payer: Self-pay

## 2019-04-06 ENCOUNTER — Ambulatory Visit (HOSPITAL_COMMUNITY)
Admission: EM | Admit: 2019-04-06 | Discharge: 2019-04-06 | Disposition: A | Discharge status: Home / Self Care | Payer: Self-pay | Attending: Family Medicine | Admitting: Family Medicine

## 2019-04-06 DIAGNOSIS — L0100 Impetigo, unspecified: Secondary | ICD-10-CM

## 2019-04-06 MED ORDER — SULFAMETHOXAZOLE-TRIMETHOPRIM 800-160 MG PO TABS: 1.0000 | ORAL_TABLET | Freq: Two times a day (BID) | ORAL | 0 refills | Status: AC

## 2019-04-06 MED ORDER — HYDROXYZINE HCL 25 MG PO TABS: 25.0000 mg | ORAL_TABLET | ORAL | 0 refills | Status: DC | PRN

## 2019-04-06 NOTE — ED Triage Notes (Signed)
Pt presents with infected wound on left leg and right fingers for about two weeks that has yellow drainage coming from it; pt states he also started with a rash about 2 weeks ago all over his body after taking medication given for tick bite.

## 2019-04-06 NOTE — Discharge Instructions (Addendum)
The skin rash looks like it is infected I am giving you antibiotics to take 2 x a day Take 2 doses today Take hydroxyzine as needed for itching Take 1 or 2 at bedtime to help you sleep Return if not improving by Monday

## 2019-04-06 NOTE — ED Provider Notes (Signed)
[facility]    CSN: [id number] Arrival date & time: 04/06/19  0818      History   Chief Complaint Chief Complaint  Patient presents with  . Infected Wound (Tick Bite)  . Rash    HPI Roosevelt JOHNHENRY TIPPIN is a 34 y.o. male.   HPI  I saw patient on 03/20/2019. He had a rash that developed on his lower leg after an occlusive dressing with Neosporin. It looks like a contact dermatitis from the neomycin. He did not have a body wide rash. He did have a couple of areas on his fingers that were also covered in vesicles. He was treated with steroids for what I thought was an allergic reaction. 5 days of prednisone p.o. and triamcinolone ointment. Patient comes back today with a body wide rash. He has small pustules on his arms and legs, some on his chest and back. He also has increased rash on his lower leg around the wound. I took photos of this for today's visit. He has increased rash on his fingers, with crusting, and pustules surrounding. He states that the wounds itch. No pain. No fever or chills. No sore throat or systemic symptoms.   Past Medical History:  Diagnosis Date  . Hypertension     Patient Active Problem List   Diagnosis Date Noted  . Facial cellulitis 12/18/2012  . Maxillary fracture ([place]) 12/18/2012    History reviewed. No pertinent surgical history.     Home Medications    Prior to Admission medications   Medication Sig Start Date End Date Taking? Authorizing Provider  hydrOXYzine (ATARAX/VISTARIL) 25 MG tablet Take 1-2 tablets (25-50 mg total) by mouth every 4 (four) hours as needed. 04/06/19   Raylene Everts, MD  sulfamethoxazole-trimethoprim (BACTRIM DS) 800-160 MG tablet Take 1 tablet by mouth 2 (two) times daily for 10 days. 04/06/19 04/16/19  Raylene Everts, MD  triamcinolone ointment (KENALOG) 0.1 % Apply 1 application topically 2 (two) times daily. 03/20/19   Raylene Everts, MD    Family History Family History  Family history  unknown: Yes    Social History Social History   Tobacco Use  . Smoking status: Never Smoker  . Smokeless tobacco: Never Used  Substance Use Topics  . Alcohol use: Not Currently    Alcohol/week: 2.0 standard drinks    Types: 2 Cans of beer per week  . Drug use: No     Allergies   Neomycin   Review of Systems Review of Systems  Constitutional: Negative for chills and fever.  HENT: Negative for congestion and sore throat.   Gastrointestinal: Negative for nausea and vomiting.  Skin: Positive for rash.     Physical Exam Triage Vital Signs ED Triage Vitals  Enc Vitals Group     BP 04/06/19 0908 134/85     Pulse Rate 04/06/19 0908 71     Resp 04/06/19 0908 16     Temp 04/06/19 0908 97.8 F (36.6 C)     Temp Source 04/06/19 0908 Oral     SpO2 04/06/19 0908 99 %     Weight --      Height --      Head Circumference --      Peak Flow --      Pain Score 04/06/19 0916 7     Pain Loc --      Pain Edu? --      Excl. in [place]? --    No  data found.  Updated Vital Signs BP 134/85 (BP Location: Left Arm)   Pulse 71   Temp 97.8 F (36.6 C) (Oral)   Resp 16   SpO2 99%       Physical Exam Constitutional:      General: He is not in acute distress.    Appearance: He is well-developed.     Comments: Patient is pleasant. Is uncomfortable with his itching  HENT:     Head: Normocephalic and atraumatic.     Mouth/Throat:     Comments: Mask in place Eyes:     Conjunctiva/sclera: Conjunctivae normal.     Pupils: Pupils are equal, round, and reactive to light.  Cardiovascular:     Rate and Rhythm: Normal rate.  Pulmonary:     Effort: Pulmonary effort is normal. No respiratory distress.  Musculoskeletal:        General: Normal range of motion.     Cervical back: Normal range of motion.  Skin:    General: Skin is warm and dry.     Comments: See photos for the rash on the leg and fingers. There are also isolated small pustules scattered over arms legs and torso.    Neurological:     Mental Status: He is alert.  Psychiatric:        Mood and Affect: Mood normal.        Behavior: Behavior normal.          UC Treatments / Results  Labs (all labs ordered are listed, but only abnormal results are displayed) Labs Reviewed - No data to display  EKG   Radiology No results found.  Procedures Procedures (including critical care time)  Medications Ordered in UC Medications - No data to display  Initial Impression / Assessment and Plan / UC Course  I have reviewed the triage vital signs and the nursing notes.  Pertinent labs & imaging results that were available during my care of the patient were reviewed by me and considered in my medical decision making (see chart for details).     I believe the patient' has impetigo. It appears to be a follicular infection as well as some infection at the prior wound site. We will treat with Septra DS. If this does not work will need to get consultation from dermatology specialty. Final Clinical Impressions(s) / UC Diagnoses   Final diagnoses:  Impetigo     Discharge Instructions     The skin rash looks like it is infected I am giving you antibiotics to take 2 x a day Take 2 doses today Take hydroxyzine as needed for itching Take 1 or 2 at bedtime to help you sleep Return if not improving by Monday    ED Prescriptions    Medication Sig Dispense Auth. Provider   sulfamethoxazole-trimethoprim (BACTRIM DS) 800-160 MG tablet Take 1 tablet by mouth 2 (two) times daily for 10 days. 20 tablet Eustace Moore, MD   hydrOXYzine (ATARAX/VISTARIL) 25 MG tablet Take 1-2 tablets (25-50 mg total) by mouth every 4 (four) hours as needed. 20 tablet Eustace Moore, MD     PDMP not reviewed this encounter.   Eustace Moore, MD 04/06/19 1030

## 2019-04-17 ENCOUNTER — Emergency Department (HOSPITAL_COMMUNITY)
Admission: EM | Admit: 2019-04-17 | Discharge: 2019-04-17 | Disposition: A | Discharge status: Home / Self Care | Payer: Self-pay | Attending: Emergency Medicine | Admitting: Emergency Medicine

## 2019-04-17 DIAGNOSIS — R21 Rash and other nonspecific skin eruption: Secondary | ICD-10-CM | POA: Insufficient documentation

## 2019-04-17 DIAGNOSIS — I10 Essential (primary) hypertension: Secondary | ICD-10-CM | POA: Insufficient documentation

## 2019-04-17 MED ORDER — DOXYCYCLINE HYCLATE 100 MG PO CAPS: 100.0000 mg | ORAL_CAPSULE | Freq: Two times a day (BID) | ORAL | 0 refills | Status: DC

## 2019-04-17 MED ORDER — PREDNISONE 50 MG PO TABS: 50.0000 mg | ORAL_TABLET | Freq: Every day | ORAL | 0 refills | Status: DC

## 2019-04-17 MED ORDER — CEPHALEXIN 500 MG PO CAPS: 500.0000 mg | ORAL_CAPSULE | Freq: Four times a day (QID) | ORAL | 0 refills | Status: DC

## 2019-04-17 MED ORDER — DEXAMETHASONE SODIUM PHOSPHATE 10 MG/ML IJ SOLN
10.0000 mg | Freq: Once | INTRAMUSCULAR | Status: AC
  Administered 2019-04-17: 10 mg via INTRAMUSCULAR
  Filled 2019-04-17: qty 1

## 2019-04-17 MED ORDER — DIPHENHYDRAMINE HCL 50 MG/ML IJ SOLN
25.0000 mg | Freq: Once | INTRAMUSCULAR | Status: AC
  Administered 2019-04-17: 21:00:00 25 mg via INTRAMUSCULAR
  Filled 2019-04-17: qty 1

## 2019-04-17 NOTE — ED Triage Notes (Signed)
Pt here from UC with a rash that looks like psoriasis , the left lower leg looks to be draining , pt has been on antibiotics but has not been seen by a dermatologist

## 2019-04-17 NOTE — ED Provider Notes (Signed)
[facility] Provider Note   CSN: [id number] Arrival date & time: 04/17/19  1841     History No chief complaint on file.   Carlos Meyers is a 34 y.o. male who presents with a rash. He states he pulled a tick off his L leg over the summer but isn't sure if it's head was left or not. The wound never completely healed and there was a "hole" there. About 2 weeks ago the area started to develop an itchy rash. He went to UC and they prescribed Triamcinolone and Prednisone. He took the full course of this but then the rash became more generalized and is on his arms, legs, and a little bit on the back of the neck and chest. There was concern for infection and he was started on Hydroxyzine and Bactrim which he took as well but didn't help. He has never had this before. He reports a subjective fever a couple days ago but none since then. He denies hx of eczema or psoriasis. He has not seen a dermatologist.   HPI     Past Medical History:  Diagnosis Date  . Hypertension     Patient Active Problem List   Diagnosis Date Noted  . Facial cellulitis 12/18/2012  . Maxillary fracture ([facility]) 12/18/2012    No past surgical history on file.     Family History  Family history unknown: Yes    Social History   Tobacco Use  . Smoking status: Never Smoker  . Smokeless tobacco: Never Used  Substance Use Topics  . Alcohol use: Not Currently    Alcohol/week: 2.0 standard drinks    Types: 2 Cans of beer per week  . Drug use: No    Home Medications Prior to Admission medications   Medication Sig Start Date End Date Taking? Authorizing Provider  hydrOXYzine (ATARAX/VISTARIL) 25 MG tablet Take 1-2 tablets (25-50 mg total) by mouth every 4 (four) hours as needed. 04/06/19   Raylene Everts, MD  triamcinolone ointment (KENALOG) 0.1 % Apply 1 application topically 2 (two) times daily. 03/20/19   Raylene Everts, MD    Allergies    Neomycin  Review  of Systems   Review of Systems  Constitutional: Positive for fever.  Skin: Positive for rash.       +itching    Physical Exam Updated Vital Signs BP (!) 143/84 (BP Location: Right Arm)   Pulse 77   Temp 98.4 F (36.9 C) (Oral)   Resp 17   Ht 5\' 6"  (1.676 m)   Wt 72.6 kg   SpO2 98%   BMI 25.82 kg/m   Physical Exam Vitals and nursing note reviewed.  Constitutional:      General: He is not in acute distress.    Appearance: Normal appearance. He is well-developed. He is not ill-appearing.  HENT:     Head: Normocephalic and atraumatic.  Eyes:     General: No scleral icterus.       Right eye: No discharge.        Left eye: No discharge.     Conjunctiva/sclera: Conjunctivae normal.     Pupils: Pupils are equal, round, and reactive to light.  Cardiovascular:     Rate and Rhythm: Normal rate.  Pulmonary:     Effort: Pulmonary effort is normal. No respiratory distress.  Abdominal:     General: There is no distension.  Musculoskeletal:     Cervical back: Normal range of motion.  Skin:  General: Skin is warm and dry.     Findings: Rash (erythematous, papular, crusting, scaling rash which clearish yellow drainage) present.     Comments: Rash is most prominent on the bilateral posterior arms and over the posterior left calf  Neurological:     Mental Status: He is alert and oriented to person, place, and time.  Psychiatric:        Behavior: Behavior normal.             ED Results / Procedures / Treatments   Labs (all labs ordered are listed, but only abnormal results are displayed) Labs Reviewed - No data to display  EKG None  Radiology No results found.  Procedures Procedures (including critical care time)  Medications Ordered in ED Medications  dexamethasone (DECADRON) injection 10 mg (10 mg Intramuscular Given 04/17/19 2105)  diphenhydrAMINE (BENADRYL) injection 25 mg (25 mg Intramuscular Given 04/17/19 2105)    ED Course  I have reviewed the  triage vital signs and the nursing notes.  Pertinent labs & imaging results that were available during my care of the patient were reviewed by me and considered in my medical decision making (see chart for details).  34 year old presents with worsening erythematous crusting, scaling rash. Unclear etiology but appears inflammatory vs fungal infection in nature. There is concern for superimposed bacteria infection as well. Either way he will need to see a dermatologist at this point for possible biopsy and culture. Low suspicion for any derm emergency such as SJS or TEN. Will treat with IM Decadron, oral steroids, Keflex, Doxy. He was given information for a local dermatologist and advised to f/u.  MDM Rules/Calculators/A&P                       Final Clinical Impression(s) / ED Diagnoses Final diagnoses:  Rash and nonspecific skin eruption    Rx / DC Orders ED Discharge Orders    None       Bethel Born, PA-C 04/18/19 1230    Benjiman Core, MD 04/19/19 [phone]

## 2019-04-17 NOTE — Discharge Instructions (Signed)
Take Prednisone once daily for 5 days Take Doxycycline 2 times daily for one week Take Keflex 4 times daily for 5 days Use Benadryl cream on the rash to help with itching Take Benadryl 25mg  once every 6 hours for itching

## 2019-06-18 ENCOUNTER — Other Ambulatory Visit: Payer: Self-pay

## 2019-06-18 ENCOUNTER — Ambulatory Visit (HOSPITAL_COMMUNITY): Payer: Self-pay

## 2019-06-18 ENCOUNTER — Ambulatory Visit (HOSPITAL_COMMUNITY)
Admission: EM | Admit: 2019-06-18 | Discharge: 2019-06-18 | Disposition: A | Discharge status: Home / Self Care | Payer: Worker's Compensation | Attending: Family Medicine | Admitting: Family Medicine

## 2019-06-18 ENCOUNTER — Ambulatory Visit (INDEPENDENT_AMBULATORY_CARE_PROVIDER_SITE_OTHER): Payer: Self-pay

## 2019-06-18 ENCOUNTER — Encounter (HOSPITAL_COMMUNITY): Payer: Self-pay

## 2019-06-18 DIAGNOSIS — M65831 Other synovitis and tenosynovitis, right forearm: Secondary | ICD-10-CM

## 2019-06-18 DIAGNOSIS — M25531 Pain in right wrist: Secondary | ICD-10-CM

## 2019-06-18 MED ORDER — BETAMETHASONE DIPROPIONATE 0.05 % EX OINT: TOPICAL_OINTMENT | Freq: Two times a day (BID) | CUTANEOUS | 0 refills | Status: DC

## 2019-06-18 MED ORDER — PREDNISONE 10 MG (21) PO TBPK: ORAL_TABLET | Freq: Every day | ORAL | 0 refills | Status: DC

## 2019-06-18 NOTE — ED Triage Notes (Signed)
Pt states he injured right wrist picking up heavy furniture 3 days ago. Pt c/o 8/10 sharp pain in right wrist. Pt denies numbness and tingling. Pt has 1+ edema of right wrist and hand, 2+ right pedal pulse, 2/5 right grip strength, cap refill less than 3 sec.

## 2019-06-18 NOTE — ED Provider Notes (Signed)
[facility]    CSN: [id number] Arrival date & time: 06/18/19  1152      History   Chief Complaint Chief Complaint  Patient presents with  . Wrist Pain    HPI Carlos Meyers is a 34 y.o. male.   HPI   Patient states that he has broken his right wrist a couple of times.  While at work he was doing some heavy lifting, and he felt a pop and pain in his wrist.  He is concerned for repeat fracture.  It swollen and painful.  Pain with moving fingers, pain with moving wrist.  No numbness  Past Medical History:  Diagnosis Date  . Hypertension     Patient Active Problem List   Diagnosis Date Noted  . Facial cellulitis 12/18/2012  . Maxillary fracture ([facility]) 12/18/2012    History reviewed. No pertinent surgical history.     Home Medications    Prior to Admission medications   Medication Sig Start Date End Date Taking? Authorizing Provider  betamethasone dipropionate (DIPROLENE) 0.05 % ointment Apply topically 2 (two) times daily. 06/18/19   Eustace Moore, MD  predniSONE (STERAPRED UNI-PAK 21 TAB) 10 MG (21) TBPK tablet Take by mouth daily. Take 6 tabs by mouth daily  for 2 days, then 5 tabs for 2 days, then 4 tabs for 2 days, then 3 tabs for 2 days, 2 tabs for 2 days, then 1 tab by mouth daily for 2 days 06/18/19   Eustace Moore, MD    Family History Family History  Problem Relation Age of Onset  . Healthy Mother   . Healthy Father     Social History Social History   Tobacco Use  . Smoking status: Never Smoker  . Smokeless tobacco: Never Used  Substance Use Topics  . Alcohol use: Not Currently    Alcohol/week: 2.0 standard drinks    Types: 2 Cans of beer per week  . Drug use: No     Allergies   Neomycin   Review of Systems Review of Systems  Musculoskeletal: Positive for arthralgias and joint swelling.     Physical Exam Triage Vital Signs ED Triage Vitals  Enc Vitals Group     BP 06/18/19 1233 122/72     Pulse Rate 06/18/19  1233 63     Resp 06/18/19 1233 18     Temp 06/18/19 1233 98.2 F (36.8 C)     Temp Source 06/18/19 1233 Oral     SpO2 06/18/19 1233 99 %     Weight 06/18/19 1237 165 lb (74.8 kg)     Height 06/18/19 1237 5\' 6"  (1.676 m)     Head Circumference --      Peak Flow --      Pain Score 06/18/19 1237 8     Pain Loc --      Pain Edu? --      Excl. in GC? --    No data found.  Updated Vital Signs BP 122/72   Pulse 63   Temp 98.2 F (36.8 C) (Oral)   Resp 18   Ht 5\' 6"  (1.676 m)   Wt 74.8 kg   SpO2 99%   BMI 26.63 kg/m     Physical Exam Constitutional:      General: He is not in acute distress.    Appearance: He is well-developed and normal weight.  HENT:     Head: Normocephalic and atraumatic.     Nose:  Comments: Mask in place Eyes:     Conjunctiva/sclera: Conjunctivae normal.     Pupils: Pupils are equal, round, and reactive to light.  Cardiovascular:     Rate and Rhythm: Normal rate.  Pulmonary:     Effort: Pulmonary effort is normal. No respiratory distress.  Musculoskeletal:        General: Normal range of motion.     Cervical back: Normal range of motion.     Comments: Right wrist with swelling over central carpal region/dorsum Tenderness with palpation at the central carpal region diffusely Pain with flexion of fingers  Skin:    General: Skin is warm and dry.     Comments: Still with rash right calf/crusted  Neurological:     Mental Status: He is alert.  Psychiatric:        Mood and Affect: Mood normal.        Behavior: Behavior normal.      UC Treatments / Results  Labs (all labs ordered are listed, but only abnormal results are displayed) Labs Reviewed - No data to display  EKG   Radiology DG Wrist Complete Right  Result Date: 06/18/2019 CLINICAL DATA:  Right hand pain after lifting injury. EXAM: RIGHT WRIST - COMPLETE 3+ VIEW COMPARISON:  10/04/2017 FINDINGS: Remote fourth and fifth metacarpal fractures. No acute fracture or subluxation. No  degenerative spurring. IMPRESSION: No acute finding. Electronically Signed   By: Monte Fantasia M.D.   On: 06/18/2019 13:34    Procedures Procedures (including critical care time)  Medications Ordered in UC Medications - No data to display  Initial Impression / Assessment and Plan / UC Course  I have reviewed the triage vital signs and the nursing notes.  Pertinent labs & imaging results that were available during my care of the patient were reviewed by me and considered in my medical decision making (see chart for details).     Discussed tenosynovitis.  RICE Final Clinical Impressions(s) / UC Diagnoses   Final diagnoses:  Right wrist pain  Extensor tenosynovitis of right wrist     Discharge Instructions     Wear brace until pain is improved Remove brace and put ice on wrist for 20 minutes every couple of hours Take the prednisone as directed.  Take all of day 1 today Use the cream daily on your rash Return as needed    ED Prescriptions    Medication Sig Dispense Auth. Provider   predniSONE (STERAPRED UNI-PAK 21 TAB) 10 MG (21) TBPK tablet Take by mouth daily. Take 6 tabs by mouth daily  for 2 days, then 5 tabs for 2 days, then 4 tabs for 2 days, then 3 tabs for 2 days, 2 tabs for 2 days, then 1 tab by mouth daily for 2 days 42 tablet Raylene Everts, MD   betamethasone dipropionate (DIPROLENE) 0.05 % ointment Apply topically 2 (two) times daily. 30 g Raylene Everts, MD     PDMP not reviewed this encounter.   Raylene Everts, MD 06/18/19 [phone]

## 2019-06-18 NOTE — Discharge Instructions (Signed)
Wear brace until pain is improved Remove brace and put ice on wrist for 20 minutes every couple of hours Take the prednisone as directed.  Take all of day 1 today Use the cream daily on your rash Return as needed

## 2019-07-25 ENCOUNTER — Ambulatory Visit (HOSPITAL_COMMUNITY)
Admission: EM | Admit: 2019-07-25 | Discharge: 2019-07-25 | Disposition: A | Discharge status: Home / Self Care | Payer: Self-pay | Attending: Family Medicine | Admitting: Family Medicine

## 2019-07-25 ENCOUNTER — Encounter (HOSPITAL_COMMUNITY): Payer: Self-pay

## 2019-07-25 ENCOUNTER — Other Ambulatory Visit: Payer: Self-pay

## 2019-07-25 ENCOUNTER — Ambulatory Visit (INDEPENDENT_AMBULATORY_CARE_PROVIDER_SITE_OTHER): Payer: Self-pay

## 2019-07-25 DIAGNOSIS — M79641 Pain in right hand: Secondary | ICD-10-CM

## 2019-07-25 DIAGNOSIS — S161XXA Strain of muscle, fascia and tendon at neck level, initial encounter: Secondary | ICD-10-CM

## 2019-07-25 DIAGNOSIS — S39012A Strain of muscle, fascia and tendon of lower back, initial encounter: Secondary | ICD-10-CM

## 2019-07-25 MED ORDER — CYCLOBENZAPRINE HCL 10 MG PO TABS: ORAL_TABLET | ORAL | 0 refills | Status: DC

## 2019-07-25 MED ORDER — IBUPROFEN 800 MG PO TABS: 800.0000 mg | ORAL_TABLET | Freq: Three times a day (TID) | ORAL | 0 refills | Status: DC
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
## 2019-08-01 ENCOUNTER — Ambulatory Visit (HOSPITAL_COMMUNITY)
Admission: RE | Admit: 2019-08-01 | Discharge: 2019-08-01 | Disposition: A | Discharge status: Home / Self Care | Payer: Self-pay | Source: Ambulatory Visit | Attending: Chiropractic Medicine | Admitting: Chiropractic Medicine

## 2019-08-01 ENCOUNTER — Other Ambulatory Visit (HOSPITAL_COMMUNITY): Payer: Self-pay | Admitting: Chiropractic Medicine

## 2019-08-01 DIAGNOSIS — R52 Pain, unspecified: Secondary | ICD-10-CM

## 2019-08-10 ENCOUNTER — Other Ambulatory Visit: Payer: Self-pay

## 2019-08-10 ENCOUNTER — Ambulatory Visit (INDEPENDENT_AMBULATORY_CARE_PROVIDER_SITE_OTHER): Payer: Self-pay

## 2019-08-10 ENCOUNTER — Ambulatory Visit: Payer: Self-pay | Admitting: Family Medicine

## 2019-08-10 ENCOUNTER — Encounter: Payer: Self-pay | Admitting: Family Medicine

## 2019-08-10 DIAGNOSIS — M25531 Pain in right wrist: Secondary | ICD-10-CM
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
## 2019-08-31 ENCOUNTER — Encounter: Payer: Self-pay | Admitting: Family Medicine

## 2019-08-31 ENCOUNTER — Ambulatory Visit (INDEPENDENT_AMBULATORY_CARE_PROVIDER_SITE_OTHER): Payer: Self-pay | Admitting: Family Medicine

## 2019-08-31 ENCOUNTER — Other Ambulatory Visit: Payer: Self-pay

## 2019-08-31 DIAGNOSIS — M25531 Pain in right wrist: Secondary | ICD-10-CM
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
## 2019-09-06 ENCOUNTER — Telehealth: Payer: Self-pay | Admitting: Family Medicine

## 2019-09-06 ENCOUNTER — Other Ambulatory Visit: Payer: Self-pay

## 2019-09-06 ENCOUNTER — Ambulatory Visit
Admission: RE | Admit: 2019-09-06 | Discharge: 2019-09-06 | Disposition: A | Discharge status: Home / Self Care | Payer: Self-pay | Source: Ambulatory Visit | Attending: Family Medicine | Admitting: Family Medicine

## 2019-09-06 DIAGNOSIS — M25531 Pain in right wrist: Secondary | ICD-10-CM

## 2019-09-06 NOTE — Telephone Encounter (Signed)
MRI shows swelling/edema of the several of the bones in the wrist.  No definite fracture seen.  No ligament tear seen.  There's some swelling in the tendon sheath of one of the tendons (extensor carpi radialis brevis) as well.    No indication for surgery based on MRI results.    Would continue wearing splint for a few more weeks, and will also make referral to hand therapy specialist.  Return for recheck in about 3 weeks.

## 2019-09-22 ENCOUNTER — Ambulatory Visit: Payer: Self-pay | Admitting: Family Medicine

## 2019-09-26 ENCOUNTER — Ambulatory Visit (INDEPENDENT_AMBULATORY_CARE_PROVIDER_SITE_OTHER): Payer: Self-pay | Admitting: Family Medicine

## 2019-09-26 ENCOUNTER — Other Ambulatory Visit: Payer: Self-pay

## 2019-09-26 ENCOUNTER — Encounter: Payer: Self-pay | Admitting: Family Medicine

## 2019-09-26 DIAGNOSIS — M25531 Pain in right wrist: Secondary | ICD-10-CM

## 2019-09-26 MED ORDER — MELOXICAM 15 MG PO TABS: 7.5000 mg | ORAL_TABLET | Freq: Every day | ORAL | 6 refills | Status: DC | PRN
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
## 2019-10-04 ENCOUNTER — Emergency Department (HOSPITAL_COMMUNITY)
Admission: EM | Admit: 2019-10-04 | Discharge: 2019-10-04 | Disposition: A | Discharge status: Home / Self Care | Payer: Self-pay | Attending: Emergency Medicine | Admitting: Emergency Medicine

## 2019-10-04 ENCOUNTER — Other Ambulatory Visit: Payer: Self-pay

## 2019-10-04 ENCOUNTER — Encounter (HOSPITAL_COMMUNITY): Payer: Self-pay | Admitting: Emergency Medicine

## 2019-10-04 DIAGNOSIS — Z79899 Other long term (current) drug therapy: Secondary | ICD-10-CM | POA: Insufficient documentation

## 2019-10-04 DIAGNOSIS — I10 Essential (primary) hypertension: Secondary | ICD-10-CM | POA: Insufficient documentation

## 2019-10-04 DIAGNOSIS — L0100 Impetigo, unspecified: Secondary | ICD-10-CM | POA: Insufficient documentation

## 2019-10-04 LAB — BASIC METABOLIC PANEL
Anion gap: 9 (ref 5–15)
BUN: 10 mg/dL (ref 6–20)
CO2: 27 mmol/L (ref 22–32)
Calcium: 9.1 mg/dL (ref 8.9–10.3)
Chloride: 103 mmol/L (ref 98–111)
Creatinine, Ser: 0.96 mg/dL (ref 0.61–1.24)
GFR calc Af Amer: >60 mL/min (ref >60)
GFR calc non Af Amer: >60 mL/min (ref >60)
Glucose, Bld: 110 mg/dL — ABNORMAL HIGH (ref 70–99)
Potassium: 4.3 mmol/L (ref 3.5–5.1)
Sodium: 139 mmol/L (ref 135–145)

## 2019-10-04 LAB — CBC WITH DIFFERENTIAL/PLATELET
Abs Immature Granulocytes: 0.02 10*3/uL (ref 0.00–0.07)
Basophils Absolute: 0 10*3/uL (ref 0.0–0.1)
Basophils Relative: 1 %
Eosinophils Absolute: 0.2 10*3/uL (ref 0.0–0.5)
Eosinophils Relative: 3 %
HCT: 47.4 % (ref 39.0–52.0)
Hemoglobin: 16.3 g/dL (ref 13.0–17.0)
Immature Granulocytes: 0 %
Lymphocytes Relative: 25 %
Lymphs Abs: 2.1 10*3/uL (ref 0.7–4.0)
MCH: 31.4 pg (ref 26.0–34.0)
MCHC: 34.4 g/dL (ref 30.0–36.0)
MCV: 91.3 fL (ref 80.0–100.0)
Monocytes Absolute: 0.8 10*3/uL (ref 0.1–1.0)
Monocytes Relative: 9 %
Neutro Abs: 5.4 10*3/uL (ref 1.7–7.7)
Neutrophils Relative %: 62 %
Platelets: 244 10*3/uL (ref 150–400)
RBC: 5.19 MIL/uL (ref 4.22–5.81)
RDW: 13.9 % (ref 11.5–15.5)
WBC: 8.5 10*3/uL (ref 4.0–10.5)
nRBC: 0 % (ref 0.0–0.2)

## 2019-10-04 LAB — HIV ANTIBODY (ROUTINE TESTING W REFLEX): HIV Screen 4th Generation wRfx: NONREACTIVE

## 2019-10-04 MED ORDER — MUPIROCIN CALCIUM 2 % EX CREA: 1.0000 "application " | TOPICAL_CREAM | Freq: Two times a day (BID) | CUTANEOUS | 0 refills | Status: DC

## 2019-10-04 MED ORDER — HYDROXYZINE HCL 25 MG PO TABS: 25.0000 mg | ORAL_TABLET | Freq: Four times a day (QID) | ORAL | 0 refills | Status: DC | PRN

## 2019-10-04 MED ORDER — MUPIROCIN CALCIUM 2 % EX CREA
TOPICAL_CREAM | Freq: Once | CUTANEOUS | Status: AC
  Filled 2019-10-04: qty 15

## 2019-10-04 MED ORDER — SULFAMETHOXAZOLE-TRIMETHOPRIM 800-160 MG PO TABS
1.0000 | ORAL_TABLET | Freq: Once | ORAL | Status: AC
  Administered 2019-10-04: 1 via ORAL
  Filled 2019-10-04: qty 1

## 2019-10-04 MED ORDER — SULFAMETHOXAZOLE-TRIMETHOPRIM 800-160 MG PO TABS: 1.0000 | ORAL_TABLET | Freq: Two times a day (BID) | ORAL | 0 refills | Status: AC

## 2019-10-04 MED ORDER — HYDROXYZINE HCL 25 MG PO TABS
25.0000 mg | ORAL_TABLET | Freq: Once | ORAL | Status: AC
  Administered 2019-10-04: 25 mg via ORAL
  Filled 2019-10-04: qty 1
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
## 2019-10-04 NOTE — ED Triage Notes (Signed)
Patient reports generalized itchy skin rashes for several days with clear drainage , denies fever , respirations unlabored.

## 2019-10-05 LAB — RPR: RPR Ser Ql: NONREACTIVE

## 2019-10-17 ENCOUNTER — Telehealth: Payer: Self-pay | Admitting: Family Medicine

## 2019-10-17 NOTE — Telephone Encounter (Signed)
Tried calling the patient to see how he is doing. Tried the number twice - it was not in service. Will try again tomorrow.

## 2019-10-17 NOTE — Telephone Encounter (Signed)
Pt states he would like to get a note clearing him to go back to work emailed or uploaded to [employer] please   [email or web address]

## 2019-10-18 NOTE — Telephone Encounter (Signed)
Tried calling the patient again. Number is still out-of-service. Sent a patient a message through MyChart requesting him to either message back or call us to let us know how he is feeling/functioning prior to release to work. Also, when is he wanting to return?

## 2019-10-20 NOTE — Telephone Encounter (Signed)
Note printed.

## 2019-10-20 NOTE — Telephone Encounter (Signed)
The note has been sent to the patient through MyChart, as requested. I will also email it to him on Monday (after 5pm now)

## 2019-10-20 NOTE — Telephone Encounter (Signed)
I tried calling the patient again today - number is not in service.   The patient is wanting to be released back to work. I have no other info than that (as in, how he's feeling and functioning) - cannot reach him by phone and he has not responded to my message through MyChart.   Please advise.

## 2019-10-27 ENCOUNTER — Telehealth: Payer: Self-pay | Admitting: Family Medicine

## 2019-10-27 NOTE — Telephone Encounter (Signed)
Received vm from Jerrel Ivory w/ Law office of [employer] checking status of request. [facility]ers [phone] I faxed records on 9/21. Advised I will go ahead and refax [phone]

## 2019-11-01 NOTE — Telephone Encounter (Signed)
Pt called stating he didn't receive his work note and would like for [place] to try it again please

## 2019-11-27 ENCOUNTER — Telehealth: Payer: Self-pay

## 2019-11-27 NOTE — Telephone Encounter (Signed)
Is this ok to do? We saw him for the first time on 08/10/19. Before that, he was treated at [facility] and they released him back to work.

## 2019-11-27 NOTE — Telephone Encounter (Signed)
I called and advised the patient of this. A new letter was written today, showing that he was evaluated on 08/10/19 and was advised not to work. Faxed to the patient's lawyer Elgie Congo) per the patient's request [phone].

## 2019-11-27 NOTE — Telephone Encounter (Signed)
Patient called in wanting to get letter that he was taken out of work on June 28th. Says he needs documentation for his lawyer

## 2019-11-27 NOTE — Telephone Encounter (Signed)
I can only give him one for the times I saw him.

## 2020-02-21 ENCOUNTER — Emergency Department (HOSPITAL_COMMUNITY)
Admission: EM | Admit: 2020-02-21 | Discharge: 2020-02-21 | Disposition: A | Discharge status: Home / Self Care | Payer: Self-pay | Attending: Emergency Medicine | Admitting: Emergency Medicine

## 2020-02-21 ENCOUNTER — Emergency Department (HOSPITAL_COMMUNITY): Payer: Self-pay

## 2020-02-21 ENCOUNTER — Encounter (HOSPITAL_COMMUNITY): Payer: Self-pay | Admitting: Emergency Medicine

## 2020-02-21 ENCOUNTER — Other Ambulatory Visit: Payer: Self-pay

## 2020-02-21 DIAGNOSIS — G5632 Lesion of radial nerve, left upper limb: Secondary | ICD-10-CM | POA: Insufficient documentation

## 2020-02-21 DIAGNOSIS — I10 Essential (primary) hypertension: Secondary | ICD-10-CM | POA: Insufficient documentation

## 2020-02-21 MED ORDER — IBUPROFEN 600 MG PO TABS: 600.0000 mg | ORAL_TABLET | Freq: Four times a day (QID) | ORAL | 0 refills | Status: DC | PRN

## 2020-02-21 MED ORDER — IBUPROFEN 400 MG PO TABS
600.0000 mg | ORAL_TABLET | Freq: Once | ORAL | Status: AC
  Administered 2020-02-21: 600 mg via ORAL
  Filled 2020-02-21: qty 1

## 2020-02-21 NOTE — Discharge Instructions (Signed)
Symptoms are likely due to a radial nerve palsy, this affects some of the sensation in your hand and the ability to extend her fingers and wrist.  Please wear wrist splint at all times to help support and hold up your wrist.  I would like you to take ibuprofen 600 mg every 6 hours to help reduce inflammation in and around the nerve that may have caused this.  Please call to schedule follow-up appointment with Dr. Orlan Leavens with hand surgery for reevaluation to ensure this is improving.  If you develop other neurologic symptoms or weakness elsewhere, please return for reevaluation.

## 2020-02-21 NOTE — ED Triage Notes (Signed)
Pt c/o left wrist pain and states he is "unable to move it". Denies injury.
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
## 2020-02-21 NOTE — Progress Notes (Signed)
Orthopedic Tech Progress Note Patient Details:  Carlos [facility]ers 03-24-1985 [id number]  Ortho Devices Type of Ortho Device: Velcro wrist splint Ortho Device/Splint Location: LUE Ortho Device/Splint Interventions: Ordered,Application   Post Interventions Patient Tolerated: Well Instructions Provided: Care of device   Donald Pore 02/21/2020, 6:42 PM

## 2020-03-11 ENCOUNTER — Other Ambulatory Visit: Payer: Self-pay

## 2020-03-11 ENCOUNTER — Encounter (HOSPITAL_COMMUNITY): Payer: Self-pay | Admitting: Emergency Medicine

## 2020-03-11 ENCOUNTER — Ambulatory Visit (HOSPITAL_COMMUNITY)
Admission: EM | Admit: 2020-03-11 | Discharge: 2020-03-11 | Disposition: A | Discharge status: Home / Self Care | Payer: Self-pay | Attending: Family Medicine | Admitting: Family Medicine

## 2020-03-11 DIAGNOSIS — M25532 Pain in left wrist: Secondary | ICD-10-CM

## 2020-03-11 NOTE — ED Triage Notes (Signed)
Pt presents for note to return to work after 02/21/20 visit at the ED. States wrist is causing no pain or discomfort.

## 2020-03-13 NOTE — ED Provider Notes (Signed)
  [facility]   [id number] 03/11/20 Arrival Time: 1053  ASSESSMENT & PLAN:  1. Left wrist pain     Improved. Work note given. Return with no restrictions.  Recommend:  Follow-up Information    [facility].   Specialty: Urgent Care Why: As needed. Contact information: [address] [place] [address] [phone]              Reviewed expectations re: course of current medical issues. Questions answered. Outlined signs and symptoms indicating need for more acute intervention. Patient verbalized understanding. After Visit Summary given.  SUBJECTIVE: History from: patient. Lux DAUNDRE BIEL is a 35 y.o. male who reports recent L wrist injury/strain. Has been wearing brace. Is better. Needs note in order to return to work.  History reviewed. No pertinent surgical history.    OBJECTIVE:  Vitals:   03/11/20 1216  BP: (!) 151/98  Pulse: 85  Resp: 17  Temp: 98.5 F (36.9 C)  TempSrc: Oral  SpO2: 97%    General appearance: alert; no distress Extremities: LUE: normal exam; FROM Skin: warm and dry; no visible rashes Neurologic: normal sensation and strength of LUE Psychological: alert and cooperative; normal mood and affect    Allergies  Allergen Reactions  . Neomycin Rash    rash    Past Medical History:  Diagnosis Date  . Hypertension    Social History   Socioeconomic History  . Marital status: Single    Spouse name: Not on file  . Number of children: Not on file  . Years of education: Not on file  . Highest education level: Not on file  Occupational History  . Not on file  Tobacco Use  . Smoking status: Never Smoker  . Smokeless tobacco: Never Used  Substance and Sexual Activity  . Alcohol use: Not Currently    Alcohol/week: 2.0 standard drinks    Types: 2 Cans of beer per week  . Drug use: No  . Sexual activity: Yes    Birth control/protection: None  Other Topics Concern  . Not on  file  Social History Narrative  . Not on file   Social Determinants of Health   Financial Resource Strain: Not on file  Food Insecurity: Not on file  Transportation Needs: Not on file  Physical Activity: Not on file  Stress: Not on file  Social Connections: Not on file   Family History  Problem Relation Age of Onset  . Healthy Mother   . Healthy Father    History reviewed. No pertinent surgical history.    Mardella Layman, MD 03/13/20 [phone]

## 2020-06-17 ENCOUNTER — Ambulatory Visit (HOSPITAL_COMMUNITY)
Admission: EM | Admit: 2020-06-17 | Discharge: 2020-06-17 | Disposition: A | Discharge status: Home / Self Care | Payer: Self-pay | Attending: Internal Medicine | Admitting: Internal Medicine

## 2020-06-17 ENCOUNTER — Encounter (HOSPITAL_COMMUNITY): Payer: Self-pay

## 2020-06-17 ENCOUNTER — Other Ambulatory Visit: Payer: Self-pay

## 2020-06-17 DIAGNOSIS — R21 Rash and other nonspecific skin eruption: Secondary | ICD-10-CM

## 2020-06-17 MED ORDER — SULFAMETHOXAZOLE-TRIMETHOPRIM 800-160 MG PO TABS: 1.0000 | ORAL_TABLET | Freq: Two times a day (BID) | ORAL | 0 refills | Status: AC

## 2020-06-17 MED ORDER — HYDROXYZINE HCL 25 MG PO TABS: 25.0000 mg | ORAL_TABLET | Freq: Four times a day (QID) | ORAL | 0 refills | Status: DC

## 2020-06-17 MED ORDER — DEXAMETHASONE SODIUM PHOSPHATE 10 MG/ML IJ SOLN
10.0000 mg | Freq: Once | INTRAMUSCULAR | Status: AC
  Administered 2020-06-17: 10 mg via INTRAMUSCULAR

## 2020-06-17 MED ORDER — DEXAMETHASONE 10 MG/ML FOR PEDIATRIC ORAL USE
INTRAMUSCULAR | Status: AC
  Filled 2020-06-17: qty 1

## 2020-06-17 NOTE — ED Triage Notes (Signed)
Pt presents with very itchy rash all over body X 3 days.

## 2020-06-17 NOTE — ED Provider Notes (Signed)
[facility]    CSN: [id number] Arrival date & time: 06/17/20  0846      History   Chief Complaint Chief Complaint  Patient presents with  . Rash    HPI Carlos Meyers is a 35 y.o. male comes to the urgent care with a 3-day history of generalized rash.  Rash is pruritic in nature and onset was fairly sudden.  Patient denies any exposures or change in cosmetics or body lotion.  No fever or chills.  Patient has had similar eruptions in the past mainly in the summertime.  He had a negative RPR and HIV done in September 2021.  He presented with similar symptoms at that time.  Patient is not sexually active.   HPI  Past Medical History:  Diagnosis Date  . Hypertension     Patient Active Problem List   Diagnosis Date Noted  . Facial cellulitis 12/18/2012  . Maxillary fracture (HCC) 12/18/2012    History reviewed. No pertinent surgical history.     Home Medications    Prior to Admission medications   Medication Sig Start Date End Date Taking? Authorizing Provider  hydrOXYzine (ATARAX/VISTARIL) 25 MG tablet Take 1 tablet (25 mg total) by mouth every 6 (six) hours. 06/17/20  Yes Kymere Fullington, Britta Mccreedy, MD  sulfamethoxazole-trimethoprim (BACTRIM DS) 800-160 MG tablet Take 1 tablet by mouth 2 (two) times daily for 7 days. 06/17/20 06/24/20 Yes Krystin Keeven, Britta Mccreedy, MD  betamethasone dipropionate (DIPROLENE) 0.05 % ointment Apply topically 2 (two) times daily. 06/18/19   Eustace Moore, MD  ibuprofen (ADVIL) 600 MG tablet Take 1 tablet (600 mg total) by mouth every 6 (six) hours as needed. 02/21/20   Dartha Lodge, PA-C  meloxicam (MOBIC) 15 MG tablet Take 0.5-1 tablets (7.5-15 mg total) by mouth daily as needed for pain. 09/26/19   Hilts, Casimiro Needle, MD  mupirocin cream (BACTROBAN) 2 % Apply 1 application topically 2 (two) times daily. 10/04/19   Jacalyn Lefevre, MD    Family History Family History  Problem Relation Age of Onset  . Healthy Mother   . Healthy Father      Social History Social History   Tobacco Use  . Smoking status: Never Smoker  . Smokeless tobacco: Never Used  Substance Use Topics  . Alcohol use: Not Currently    Alcohol/week: 2.0 standard drinks    Types: 2 Cans of beer per week  . Drug use: No     Allergies   Neomycin   Review of Systems Review of Systems  Constitutional: Negative.   Respiratory: Negative.   Cardiovascular: Negative.   Gastrointestinal: Negative.   Musculoskeletal: Negative.   Skin: Positive for rash. Negative for color change and wound.     Physical Exam Triage Vital Signs ED Triage Vitals  Enc Vitals Group     BP 06/17/20 1005 (!) 144/79     Pulse Rate 06/17/20 1005 70     Resp 06/17/20 1005 18     Temp 06/17/20 1005 98 F (36.7 C)     Temp Source 06/17/20 1005 Oral     SpO2 06/17/20 1005 100 %     Weight --      Height --      Head Circumference --      Peak Flow --      Pain Score 06/17/20 1006 0     Pain Loc --      Pain Edu? --      Excl. in GC? --  No data found.  Updated Vital Signs BP (!) 144/79 (BP Location: Right Arm)   Pulse 70   Temp 98 F (36.7 C) (Oral)   Resp 18   SpO2 100%   Visual Acuity Right Eye Distance:   Left Eye Distance:   Bilateral Distance:    Right Eye Near:   Left Eye Near:    Bilateral Near:     Physical Exam Vitals and nursing note reviewed.  Constitutional:      General: He is not in acute distress.    Appearance: He is not ill-appearing.  Cardiovascular:     Rate and Rhythm: Normal rate and regular rhythm.     Pulses: Normal pulses.     Heart sounds: Normal heart sounds.  Pulmonary:     Effort: Pulmonary effort is normal.     Breath sounds: Normal breath sounds.  Abdominal:     General: Bowel sounds are normal.     Palpations: Abdomen is soft.  Musculoskeletal:        General: Normal range of motion.  Skin:    General: Skin is warm.     Comments: Papular rash over the torso, upper extremities and the lower extremities  as well as face.  Rashes are nontender and does not have a erythematous base.  No signs of excoriations.  Multiple sites of shallow ulcerations.  No palm or sole of the foot rash noted  Neurological:     Mental Status: He is alert.      UC Treatments / Results  Labs (all labs ordered are listed, but only abnormal results are displayed) Labs Reviewed - No data to display  EKG   Radiology No results found.  Procedures Procedures (including critical care time)  Medications Ordered in UC Medications  dexamethasone (DECADRON) injection 10 mg (has no administration in time range)    Initial Impression / Assessment and Plan / UC Course  I have reviewed the triage vital signs and the nursing notes.  Pertinent labs & imaging results that were available during my care of the patient were reviewed by me and considered in my medical decision making (see chart for details).     1.  Rash (?  Rheumatologic versus sun exposure related): Decadron 10 mg IM x1 dose Bactrim double strength 1 twice daily for 7 days Hydroxyzine as needed for itching Return to urgent care if symptoms worsen If the rash keeps erupting you may benefit from a dermatology evaluation. Final Clinical Impressions(s) / UC Diagnoses   Final diagnoses:  Rash and nonspecific skin eruption     Discharge Instructions     Use medications as directed If symptoms worsen please return to the urgent care After the ulcerations and rash resolves, please apply sunscreen when going outdoors.   ED Prescriptions    Medication Sig Dispense Auth. Provider   sulfamethoxazole-trimethoprim (BACTRIM DS) 800-160 MG tablet Take 1 tablet by mouth 2 (two) times daily for 7 days. 14 tablet Suha Schoenbeck, Britta Mccreedy, MD   hydrOXYzine (ATARAX/VISTARIL) 25 MG tablet Take 1 tablet (25 mg total) by mouth every 6 (six) hours. 21 tablet Corneshia Hines, Britta Mccreedy, MD     PDMP not reviewed this encounter.   Merrilee Jansky, MD 06/17/20 1051

## 2020-06-17 NOTE — Discharge Instructions (Addendum)
Use medications as directed If symptoms worsen please return to the urgent care After the ulcerations and rash resolves, please apply sunscreen when going outdoors.

## 2021-01-06 ENCOUNTER — Other Ambulatory Visit: Payer: Self-pay

## 2021-01-06 ENCOUNTER — Encounter (HOSPITAL_COMMUNITY): Payer: Self-pay

## 2021-01-06 ENCOUNTER — Ambulatory Visit (HOSPITAL_COMMUNITY)
Admission: EM | Admit: 2021-01-06 | Discharge: 2021-01-06 | Disposition: A | Discharge status: Home / Self Care | Payer: Self-pay | Attending: Family Medicine | Admitting: Family Medicine

## 2021-01-06 ENCOUNTER — Emergency Department (HOSPITAL_COMMUNITY)
Admission: EM | Admit: 2021-01-06 | Discharge: 2021-01-06 | Disposition: A | Discharge status: Home / Self Care | Payer: Self-pay | Attending: Emergency Medicine | Admitting: Emergency Medicine

## 2021-01-06 ENCOUNTER — Emergency Department (HOSPITAL_COMMUNITY): Payer: Self-pay

## 2021-01-06 DIAGNOSIS — L0231 Cutaneous abscess of buttock: Secondary | ICD-10-CM | POA: Insufficient documentation

## 2021-01-06 DIAGNOSIS — I10 Essential (primary) hypertension: Secondary | ICD-10-CM | POA: Insufficient documentation

## 2021-01-06 LAB — COMPREHENSIVE METABOLIC PANEL
ALT: 16 U/L (ref 0–44)
AST: 17 U/L (ref 15–41)
Albumin: 4.4 g/dL (ref 3.5–5.0)
Alkaline Phosphatase: 73 U/L (ref 38–126)
Anion gap: 9 (ref 5–15)
BUN: 7 mg/dL (ref 6–20)
CO2: 30 mmol/L (ref 22–32)
Calcium: 9.7 mg/dL (ref 8.9–10.3)
Chloride: 100 mmol/L (ref 98–111)
Creatinine, Ser: 0.92 mg/dL (ref 0.61–1.24)
GFR, Estimated: >60 mL/min (ref >60)
Glucose, Bld: 124 mg/dL — ABNORMAL HIGH (ref 70–99)
Potassium: 4.3 mmol/L (ref 3.5–5.1)
Sodium: 139 mmol/L (ref 135–145)
Total Bilirubin: 1.1 mg/dL (ref 0.3–1.2)
Total Protein: 7.6 g/dL (ref 6.5–8.1)

## 2021-01-06 LAB — CBC WITH DIFFERENTIAL/PLATELET
Abs Immature Granulocytes: 0.03 10*3/uL (ref 0.00–0.07)
Basophils Absolute: 0 10*3/uL (ref 0.0–0.1)
Basophils Relative: 0 %
Eosinophils Absolute: 0.1 10*3/uL (ref 0.0–0.5)
Eosinophils Relative: 1 %
HCT: 47.8 % (ref 39.0–52.0)
Hemoglobin: 16.3 g/dL (ref 13.0–17.0)
Immature Granulocytes: 0 %
Lymphocytes Relative: 10 %
Lymphs Abs: 1.1 10*3/uL (ref 0.7–4.0)
MCH: 31.5 pg (ref 26.0–34.0)
MCHC: 34.1 g/dL (ref 30.0–36.0)
MCV: 92.3 fL (ref 80.0–100.0)
Monocytes Absolute: 1 10*3/uL (ref 0.1–1.0)
Monocytes Relative: 9 %
Neutro Abs: 8.8 10*3/uL — ABNORMAL HIGH (ref 1.7–7.7)
Neutrophils Relative %: 80 %
Platelets: 244 10*3/uL (ref 150–400)
RBC: 5.18 MIL/uL (ref 4.22–5.81)
RDW: 13.8 % (ref 11.5–15.5)
WBC: 11.1 10*3/uL — ABNORMAL HIGH (ref 4.0–10.5)
nRBC: 0 % (ref 0.0–0.2)

## 2021-01-06 MED ORDER — ACETAMINOPHEN 325 MG PO TABS
650.0000 mg | ORAL_TABLET | Freq: Once | ORAL | Status: AC
  Administered 2021-01-06: 650 mg via ORAL
  Filled 2021-01-06: qty 2

## 2021-01-06 MED ORDER — HYDROCODONE-ACETAMINOPHEN 5-325 MG PO TABS
1.0000 | ORAL_TABLET | Freq: Once | ORAL | Status: AC
  Administered 2021-01-06: 1 via ORAL
  Filled 2021-01-06: qty 1

## 2021-01-06 MED ORDER — LIDOCAINE-EPINEPHRINE 1 %-1:100000 IJ SOLN
INTRAMUSCULAR | Status: AC
  Filled 2021-01-06: qty 1

## 2021-01-06 MED ORDER — HYDROCODONE-ACETAMINOPHEN 5-325 MG PO TABS: 2.0000 | ORAL_TABLET | Freq: Four times a day (QID) | ORAL | 0 refills | Status: AC | PRN

## 2021-01-06 MED ORDER — LIDOCAINE-EPINEPHRINE (PF) 2 %-1:200000 IJ SOLN
10.0000 mL | Freq: Once | INTRAMUSCULAR | Status: AC
  Administered 2021-01-06: 10 mL via INTRADERMAL
  Filled 2021-01-06: qty 20

## 2021-01-06 MED ORDER — IOHEXOL 300 MG/ML  SOLN
100.0000 mL | Freq: Once | INTRAMUSCULAR | Status: AC | PRN
  Administered 2021-01-06: 100 mL via INTRAVENOUS

## 2021-01-06 MED ORDER — CEPHALEXIN 500 MG PO CAPS: 500.0000 mg | ORAL_CAPSULE | Freq: Four times a day (QID) | ORAL | 0 refills | Status: AC

## 2021-01-06 NOTE — Discharge Instructions (Signed)
You had a large abscess of your left buttock.  The abscess has been drained and the wound has been left open.  Please apply topical bacitracin or other antibacterial ointment on top of the wound.  Please clean with soap and water 2 times daily.  Please pick up your prescription for Keflex, which is an antibiotic that you will take for the next 5 days.  We have also sent you a prescription for pain medicine.  Return to the ED with any worsening fevers, pain, drainage, blood in your stool, or pain with defecation.

## 2021-01-06 NOTE — Discharge Instructions (Addendum)
Please proceed to the emergency room for more definitive treatment.

## 2021-01-06 NOTE — ED Triage Notes (Signed)
Pt sent from UC for abscess on buttocks for two days. Pt states UC lanced abscess, but was referred here.

## 2021-01-06 NOTE — ED Notes (Signed)
Pt not present at this time. Will call again for vitals later.

## 2021-01-06 NOTE — ED Provider Notes (Signed)
Emergency Medicine Provider Triage Evaluation Note  Carlos Meyers , a 35 y.o. male  was evaluated in triage.  Pt complains of left buttocks abscess x2 days. Patient evaluated at [facility] prior to arrival where I&D was performed with no success. Denies difficulties with BM. No fever or chills.   Review of Systems  Positive: abscess Negative: Rectal bleeding  Physical Exam  BP (!) 132/96   Pulse 95   Temp 98 F (36.7 C)   Resp 18   SpO2 100%  Gen:   Awake, no distress   Resp:  Normal effort  MSK:   Moves extremities without difficulty  Other:  Abscess assessed with chaperone in room. Tenderness throughout inner left buttocks with induration.  Medical Decision Making  Medically screening exam initiated at 10:54 AM.  Appropriate orders placed.  [name] was informed that the remainder of the evaluation will be completed by another provider, this initial triage assessment does not replace that evaluation, and the importance of remaining in the ED until their evaluation is complete.  Abscess with failed I&D prior. Given failure of I&D will obtain CT pelvis to assess degree of abscess. Labs ordered.    Mannie Stabile, PA-C 01/06/21 1057    Bethann Berkshire, MD 01/06/21 [phone]

## 2021-01-06 NOTE — ED Triage Notes (Signed)
Pt presents with an abscess on his buttocks X  2 days.

## 2021-01-06 NOTE — ED Provider Notes (Signed)
[facility]    CSN: [id number] Arrival date & time: 01/06/21  0806      History   Chief Complaint Chief Complaint  Patient presents with   Abscess    HPI Carlos Meyers is a 35 y.o. male.    Abscess Here for a 1 day h/o rectal pain and swelling, c/w abscess he has had before.  Also had fever and vomiting, beginning 12/8, resolved by 12/10. Had diarrhea/loose stools begin then, and have continued, green, 4-5 daily. No blood.    Past Medical History:  Diagnosis Date   Hypertension     Patient Active Problem List   Diagnosis Date Noted   Facial cellulitis 12/18/2012   Maxillary fracture (HCC) 12/18/2012    History reviewed. No pertinent surgical history.     Home Medications    Prior to Admission medications   Not on File    Family History Family History  Problem Relation Age of Onset   Healthy Mother    Healthy Father     Social History Social History   Tobacco Use   Smoking status: Never   Smokeless tobacco: Never  Substance Use Topics   Alcohol use: Not Currently    Alcohol/week: 2.0 standard drinks    Types: 2 Cans of beer per week   Drug use: No     Allergies   Neomycin   Review of Systems Review of Systems   Physical Exam Triage Vital Signs ED Triage Vitals  Enc Vitals Group     BP 01/06/21 0826 135/70     Pulse Rate 01/06/21 0826 78     Resp 01/06/21 0826 19     Temp 01/06/21 0826 98.1 F (36.7 C)     Temp Source 01/06/21 0826 Oral     SpO2 01/06/21 0826 99 %     Weight --      Height --      Head Circumference --      Peak Flow --      Pain Score 01/06/21 0825 6     Pain Loc --      Pain Edu? --      Excl. in GC? --    No data found.  Updated Vital Signs BP 135/70 (BP Location: Right Arm)   Pulse 78   Temp 98.1 F (36.7 C) (Oral)   Resp 19   SpO2 99%   Visual Acuity Right Eye Distance:   Left Eye Distance:   Bilateral Distance:    Right Eye Near:   Left Eye Near:    Bilateral Near:      Physical Exam Vitals reviewed.  Constitutional:      General: He is in acute distress (in obvious pain).  Eyes:     Extraocular Movements: Extraocular movements intact.     Pupils: Pupils are equal, round, and reactive to light.  Abdominal:     Palpations: Abdomen is soft.  Genitourinary:    Comments: At left aspect of rectum and superior, there is an area of induration and swelling. Some fluctuance. Very, tender. Skin:    Coloration: Skin is not pale.  Neurological:     Mental Status: He is alert.     UC Treatments / Results  Labs (all labs ordered are listed, but only abnormal results are displayed) Labs Reviewed - No data to display  EKG   Radiology No results found.  Procedures Procedures (including critical care time)  Medications Ordered in UC Medications - No data  to display  Initial Impression / Assessment and Plan / UC Course  I have reviewed the triage vital signs and the nursing notes.  Pertinent labs & imaging results that were available during my care of the patient were reviewed by me and considered in my medical decision making (see chart for details).     I and D attempted after informed consent, and we had discussed my procedure here may not be enough. Lidocaine with epi injected, with fair anesthesia. Stab wound made with a #11 blade, and no pus, and minimal blood obtained.  Pt will proceed to the ER for further management, as this could be a deeper perirectal abscess (we tried to manage it here with his having no coverage). Final Clinical Impressions(s) / UC Diagnoses   Final diagnoses:  Abscess of buttock, left     Discharge Instructions      Please proceed to the emergency room for more definitive treatment.    ED Prescriptions   None    I have reviewed the PDMP during this encounter.   Zenia Resides, MD 01/06/21 0930

## 2021-01-06 NOTE — ED Notes (Signed)
Did not have contact with patient, only removed name from Epic at ED request

## 2021-01-06 NOTE — ED Provider Notes (Signed)
[facility]   CSN: [id number] Arrival date & time: 01/06/21  [id number]     History Chief Complaint  Patient presents with   Abscess    Carlos Meyers is a 35 y.o. male.  The history is provided by the patient and medical records.  Abscess Location:  Pelvis Pelvic abscess location:  L buttock Size:  4 cm Abscess quality: fluctuance, painful and redness   Red streaking: no   Duration:  2 days Progression:  Worsening Pain details:    Quality:  Aching   Severity:  Moderate   Timing:  Constant   Progression:  Worsening Context: not diabetes and not immunosuppression   Relieved by:  Nothing Exacerbated by: sitting. Associated symptoms: no fever and no vomiting   Risk factors: prior abscess   Risk factors: no hx of MRSA       Past Medical History:  Diagnosis Date   Hypertension     Patient Active Problem List   Diagnosis Date Noted   Facial cellulitis 12/18/2012   Maxillary fracture (HCC) 12/18/2012    History reviewed. No pertinent surgical history.     Family History  Problem Relation Age of Onset   Healthy Mother    Healthy Father     Social History   Tobacco Use   Smoking status: Never   Smokeless tobacco: Never  Substance Use Topics   Alcohol use: Not Currently    Alcohol/week: 2.0 standard drinks    Types: 2 Cans of beer per week   Drug use: No    Home Medications Prior to Admission medications   Medication Sig Start Date End Date Taking? Authorizing Provider  cephALEXin (KEFLEX) 500 MG capsule Take 1 capsule (500 mg total) by mouth 4 (four) times daily for 5 days. 01/06/21 01/11/21 Yes Natally Ribera, Birdie RiddleJett, MD  HYDROcodone-acetaminophen (NORCO/VICODIN) 5-325 MG tablet Take 2 tablets by mouth every 6 (six) hours as needed for up to 2 days. 01/06/21 01/08/21 Yes Greg Eckrich, Birdie RiddleJett, MD    Allergies    Neomycin  Review of Systems   Review of Systems  Constitutional:  Negative for chills and  fever.  HENT:  Negative for ear pain and sore throat.   Eyes:  Negative for pain and visual disturbance.  Respiratory:  Negative for cough and shortness of breath.   Cardiovascular:  Negative for chest pain and palpitations.  Gastrointestinal:  Negative for abdominal pain and vomiting.  Genitourinary:  Negative for dysuria and hematuria.  Musculoskeletal:  Negative for arthralgias and back pain.  Skin:  Positive for wound. Negative for color change and rash.  Neurological:  Negative for seizures and syncope.  All other systems reviewed and are negative.  Physical Exam Updated Vital Signs BP 135/75   Pulse 73   Temp 98 F (36.7 C)   Resp 16   Ht 5\' 6"  (1.676 m)   Wt 65.8 kg   SpO2 99%   BMI 23.40 kg/m   Physical Exam Vitals and nursing note reviewed.  Constitutional:      General: He is not in acute distress.    Appearance: Normal appearance. He is well-developed.  HENT:     Head: Normocephalic and atraumatic.     Right Ear: External ear normal.     Left Ear: External ear normal.     Nose: Nose normal. No congestion.     Mouth/Throat:     Mouth: Mucous membranes are moist.     Pharynx: Oropharynx is clear.  No posterior oropharyngeal erythema.  Eyes:     Extraocular Movements: Extraocular movements intact.     Conjunctiva/sclera: Conjunctivae normal.     Pupils: Pupils are equal, round, and reactive to light.  Cardiovascular:     Rate and Rhythm: Normal rate and regular rhythm.     Pulses: Normal pulses.     Heart sounds: No murmur heard. Pulmonary:     Effort: Pulmonary effort is normal. No respiratory distress.     Breath sounds: Normal breath sounds. No wheezing, rhonchi or rales.  Abdominal:     General: Abdomen is flat. Bowel sounds are normal.     Palpations: Abdomen is soft.     Tenderness: There is no abdominal tenderness. There is no guarding or rebound.  Musculoskeletal:        General: No swelling, tenderness or deformity. Normal range of motion.      Cervical back: Normal range of motion and neck supple. No rigidity.  Skin:    General: Skin is warm and dry.     Capillary Refill: Capillary refill takes less than 2 seconds.     Findings: No rash.  Neurological:     General: No focal deficit present.     Mental Status: He is alert and oriented to person, place, and time.  Psychiatric:        Mood and Affect: Mood normal.    ED Results / Procedures / Treatments   Labs (all labs ordered are listed, but only abnormal results are displayed) Labs Reviewed  CBC WITH DIFFERENTIAL/PLATELET - Abnormal; Notable for the following components:      Result Value   WBC 11.1 (*)    Neutro Abs 8.8 (*)    All other components within normal limits  COMPREHENSIVE METABOLIC PANEL - Abnormal; Notable for the following components:   Glucose, Bld 124 (*)    All other components within normal limits    EKG None  Radiology CT PELVIS W CONTRAST  Result Date: 01/06/2021 CLINICAL DATA:  35 year old male with gluteal abscess EXAM: CT PELVIS WITH CONTRAST TECHNIQUE: Multidetector CT imaging of the pelvis was performed using the standard protocol following the bolus administration of intravenous contrast. CONTRAST:  147mL OMNIPAQUE IOHEXOL 300 MG/ML  SOLN COMPARISON:  11/18/2013 FINDINGS: Urinary Tract: Urinary bladder partially distended. No radiopaque stones. Kidneys not visualized. Bowel: Visualized small bowel and colon unremarkable. Normal appendix. Vascular/Lymphatic: Unremarkable appearance of the bilateral iliac arteries and proximal femoral arteries. Unremarkable venous structures. Numerous borderline lymph nodes of the left inguinal region. Small lymph nodes within the right inguinal region. Small lymph nodes in the left pelvis and along the left iliac nodal station, none of which are enlarged. Reproductive:  Unremarkable appearance of the prostate. Other:  None Musculoskeletal: Edema/inflammation within the left midline gluteal soft tissues. There is a  lentiform low-density region measuring estimated 4.7 cm with some low-density/hypoenhancing central region. The greatest thickness is estimated 14 mm. No radiopaque foreign body. Unremarkable appearance of the ischial rectal fossa bilaterally, as well as the intrapelvic fat. No acute displaced fracture. No degenerative changes of the spine. Unremarkable appearance of the hips. IMPRESSION: Inflammatory changes of the left gluteal cleft in the midline with associated early abscess/phlegmon, estimated 4.7 cm x 1.4 cm. Associated reactive adenopathy of the left inguinal region and pelvis Unremarkable appearance of the bilateral ischial rectal fossa and intrapelvic structures. Electronically Signed   By: Corrie Mckusick D.O.   On: 01/06/2021 14:04    Procedures .Marland KitchenIncision and Drainage  Date/Time:  01/06/2021 7:58 PM Performed by: Lutricia Feil, MD Authorized by: Maia Plan, MD   Consent:    Consent obtained:  Verbal   Consent given by:  Patient   Risks, benefits, and alternatives were discussed: yes     Risks discussed:  Bleeding, incomplete drainage, pain, damage to other organs and infection   Alternatives discussed:  No treatment and delayed treatment Universal protocol:    Procedure explained and questions answered to patient or proxy's satisfaction: yes     Relevant documents present and verified: yes     Patient identity confirmed:  Verbally with patient, hospital-assigned identification number and arm band Location:    Type:  Abscess   Size:  4.5 cm   Location:  Anogenital   Anogenital location: L gluteal. Pre-procedure details:    Skin preparation:  Chlorhexidine Sedation:    Sedation type:  None Anesthesia:    Anesthesia method:  Local infiltration   Local anesthetic:  Lidocaine 1% WITH epi Procedure type:    Complexity:  Complex Procedure details:    Ultrasound guidance: yes     Incision types:  Single straight   Incision depth:  Subcutaneous   Wound management:  Probed  and deloculated and extensive cleaning   Drainage:  Purulent   Drainage amount:  Moderate   Wound treatment:  Wound left open Post-procedure details:    Procedure completion:  Tolerated   Medications Ordered in ED Medications  lidocaine-EPINEPHrine (XYLOCAINE W/EPI) 2 %-1:200000 (PF) injection 10 mL (has no administration in time range)  iohexol (OMNIPAQUE) 300 MG/ML solution 100 mL (100 mLs Intravenous Contrast Given 01/06/21 1350)  acetaminophen (TYLENOL) tablet 650 mg (650 mg Oral Given 01/06/21 1528)  HYDROcodone-acetaminophen (NORCO/VICODIN) 5-325 MG per tablet 1 tablet (1 tablet Oral Given 01/06/21 1920)    ED Course  I have reviewed the triage vital signs and the nursing notes.  Pertinent labs & imaging results that were available during my care of the patient were reviewed by me and considered in my medical decision making (see chart for details).    MDM Rules/Calculators/A&P                          35 year old male presenting with left buttock abscess.  He is afebrile and hemodynamically stable.  Nontoxic-appearing.  White count normal.  Low concerns for sepsis.  CT pelvis showed no concerns fistula tract or perirectal abscess.  Bedside I&D performed as above.  Patient tolerated well. Large amount of purulent drainage expressed.  Wound left open.  Patient appropriate for discharge home with p.o. antibiotics and close follow-up with PCP for reassessment.  Strict return to ED precautions provided.  Appropriate wound care advised.  Final Clinical Impression(s) / ED Diagnoses Final diagnoses:  Left buttock abscess    Rx / DC Orders ED Discharge Orders          Ordered    cephALEXin (KEFLEX) 500 MG capsule  4 times daily        01/06/21 1956    HYDROcodone-acetaminophen (NORCO/VICODIN) 5-325 MG tablet  Every 6 hours PRN        01/06/21 1956             Lutricia Feil, MD 01/06/21 2000    Maia Plan, MD 01/09/21 [phone]

## 2021-02-15 ENCOUNTER — Emergency Department (HOSPITAL_COMMUNITY)
Admission: EM | Admit: 2021-02-15 | Discharge: 2021-02-17 | Disposition: A | Discharge status: Home / Self Care | Payer: Self-pay | Attending: Emergency Medicine | Admitting: Emergency Medicine

## 2021-02-15 ENCOUNTER — Other Ambulatory Visit: Payer: Self-pay

## 2021-02-15 ENCOUNTER — Encounter (HOSPITAL_COMMUNITY): Payer: Self-pay

## 2021-02-15 DIAGNOSIS — R45851 Suicidal ideations: Secondary | ICD-10-CM | POA: Insufficient documentation

## 2021-02-15 DIAGNOSIS — Z20822 Contact with and (suspected) exposure to covid-19: Secondary | ICD-10-CM | POA: Insufficient documentation

## 2021-02-15 DIAGNOSIS — F29 Unspecified psychosis not due to a substance or known physiological condition: Secondary | ICD-10-CM | POA: Insufficient documentation

## 2021-02-15 DIAGNOSIS — R443 Hallucinations, unspecified: Secondary | ICD-10-CM

## 2021-02-15 DIAGNOSIS — F329 Major depressive disorder, single episode, unspecified: Secondary | ICD-10-CM | POA: Insufficient documentation

## 2021-02-15 LAB — CBC WITH DIFFERENTIAL/PLATELET
Abs Immature Granulocytes: 0.01 10*3/uL (ref 0.00–0.07)
Basophils Absolute: 0.1 10*3/uL (ref 0.0–0.1)
Basophils Relative: 1 %
Eosinophils Absolute: 0.1 10*3/uL (ref 0.0–0.5)
Eosinophils Relative: 1 %
HCT: 44.3 % (ref 39.0–52.0)
Hemoglobin: 15.6 g/dL (ref 13.0–17.0)
Immature Granulocytes: 0 %
Lymphocytes Relative: 25 %
Lymphs Abs: 1.9 10*3/uL (ref 0.7–4.0)
MCH: 31.3 pg (ref 26.0–34.0)
MCHC: 35.2 g/dL (ref 30.0–36.0)
MCV: 89 fL (ref 80.0–100.0)
Monocytes Absolute: 0.8 10*3/uL (ref 0.1–1.0)
Monocytes Relative: 10 %
Neutro Abs: 4.8 10*3/uL (ref 1.7–7.7)
Neutrophils Relative %: 63 %
Platelets: 241 10*3/uL (ref 150–400)
RBC: 4.98 MIL/uL (ref 4.22–5.81)
RDW: 13.1 % (ref 11.5–15.5)
WBC: 7.6 10*3/uL (ref 4.0–10.5)
nRBC: 0 % (ref 0.0–0.2)

## 2021-02-15 LAB — SALICYLATE LEVEL: Salicylate Lvl: <7 mg/dL — ABNORMAL LOW (ref 7.0–30.0)

## 2021-02-15 LAB — COMPREHENSIVE METABOLIC PANEL
ALT: 11 U/L (ref 0–44)
AST: 16 U/L (ref 15–41)
Albumin: 4.6 g/dL (ref 3.5–5.0)
Alkaline Phosphatase: 59 U/L (ref 38–126)
Anion gap: 8 (ref 5–15)
BUN: 11 mg/dL (ref 6–20)
CO2: 26 mmol/L (ref 22–32)
Calcium: 9.3 mg/dL (ref 8.9–10.3)
Chloride: 103 mmol/L (ref 98–111)
Creatinine, Ser: 0.81 mg/dL (ref 0.61–1.24)
GFR, Estimated: >60 mL/min (ref >60)
Glucose, Bld: 98 mg/dL (ref 70–99)
Potassium: 4.4 mmol/L (ref 3.5–5.1)
Sodium: 137 mmol/L (ref 135–145)
Total Bilirubin: 1.2 mg/dL (ref 0.3–1.2)
Total Protein: 7.6 g/dL (ref 6.5–8.1)

## 2021-02-15 LAB — RESP PANEL BY RT-PCR (FLU A&B, COVID) ARPGX2
Influenza A by PCR: NEGATIVE
Influenza B by PCR: NEGATIVE
SARS Coronavirus 2 by RT PCR: NEGATIVE

## 2021-02-15 LAB — ETHANOL: Alcohol, Ethyl (B): <10 mg/dL (ref <10)

## 2021-02-15 LAB — ACETAMINOPHEN LEVEL: Acetaminophen (Tylenol), Serum: <10 ug/mL — ABNORMAL LOW (ref 10–30)

## 2021-02-15 MED ORDER — TRAZODONE HCL 50 MG PO TABS
50.0000 mg | ORAL_TABLET | Freq: Every day | ORAL | Status: DC
  Administered 2021-02-15 – 2021-02-16 (×2): 50 mg via ORAL
  Filled 2021-02-15 (×2): qty 1

## 2021-02-15 MED ORDER — RISPERIDONE 1 MG PO TABS
1.0000 mg | ORAL_TABLET | Freq: Every day | ORAL | Status: DC
Start: 2021-02-15 — End: 2021-02-17
  Administered 2021-02-15 – 2021-02-16 (×2): 1 mg via ORAL
  Filled 2021-02-15 (×2): qty 1

## 2021-02-15 NOTE — ED Notes (Signed)
Pt has one belonging bag in the cabinet at the 1-8 nursing station

## 2021-02-15 NOTE — ED Provider Notes (Signed)
[facility]-EMERGENCY DEPT Provider Note   CSN: [id number] Arrival date & time: 02/15/21  1253     History  Chief Complaint  Patient presents with   Hallucinations   Suicidal    Carlos Meyers is a 36 y.o. male.  HPI  36 year old male presents to the emergency department today for a psychiatric evaluation.  He states that he has had auditory hallucinations for many years.  He states they are telling him to harm himself.  He has had the symptoms for years but feels that they are becoming more difficult to tolerate so he came here for further assessment.  He denies any plan of SI.  He denies HI.  He denies any drug use.  He does admit to alcohol use but is not able to quantify how much he drinks.  He states he drinks until he stops hearing voices.  He denies any medical complaints at this time.  Home Medications Prior to Admission medications   Not on File      Allergies    Neomycin    Review of Systems   Review of Systems  See HPI for pertinent positives or negatives.  Physical Exam Updated Vital Signs BP (!) 146/91 (BP Location: Left Arm)    Pulse 85    Temp 98.5 F (36.9 C) (Oral)    Resp 18    SpO2 97%  Physical Exam Constitutional:      General: He is not in acute distress.    Appearance: He is well-developed.  Eyes:     Conjunctiva/sclera: Conjunctivae normal.  Cardiovascular:     Rate and Rhythm: Normal rate.  Pulmonary:     Effort: Pulmonary effort is normal.  Abdominal:     General: Abdomen is flat.  Skin:    General: Skin is warm and dry.  Neurological:     Mental Status: He is alert and oriented to person, place, and time.     ED Results / Procedures / Treatments   Labs (all labs ordered are listed, but only abnormal results are displayed) Labs Reviewed  ACETAMINOPHEN LEVEL - Abnormal; Notable for the following components:      Result Value   Acetaminophen (Tylenol), Serum <10 (*)    All other components within normal  limits  SALICYLATE LEVEL - Abnormal; Notable for the following components:   Salicylate Lvl <7.0 (*)    All other components within normal limits  RESP PANEL BY RT-PCR (FLU A&B, COVID) [id number]  COMPREHENSIVE METABOLIC PANEL  ETHANOL  CBC WITH DIFFERENTIAL/PLATELET  CBC WITH DIFFERENTIAL/PLATELET  RAPID URINE DRUG SCREEN, HOSP PERFORMED    EKG None  Radiology No results found.  Procedures Procedures    Medications Ordered in ED Medications  risperiDONE (RISPERDAL) tablet 1 mg (has no administration in time range)  traZODone (DESYREL) tablet 50 mg (has no administration in time range)    ED Course/ Medical Decision Making/ A&P                           Medical Decision Making Amount and/or Complexity of Data Reviewed Labs: ordered.   36 year old male presenting for auditory hallucinations, specifically command hallucinations telling him to harm himself.  He does not have any specific plan.  He denies any drug use but does admit to daily alcohol use.  Last drink was last night.  Does not appear to be in acute withdrawal.  Will order psychiatric laboratory work and consult TTS.  Reviewed/interpreted labs CBC wnl  CMP unremarkable ETOH negative Acetaminophen neg Salicylate neg  Pt does not have an emergent medical condition that would warrant further w/u or admission at this time. He is appropriate for TTS eval.   Police have been at bedside throughout the entirety of the patient's ED stay. He did verbally consent to discussing his plan of care in front of the officers.  Pt recommended for inpatient treatment. Medications were ordered.   Final Clinical Impression(s) / ED Diagnoses Final diagnoses:  Hallucinations    Rx / DC Orders ED Discharge Orders     None         Rayne Du 02/15/21 2154    Pollyann Savoy, MD 02/15/21 2227

## 2021-02-15 NOTE — ED Triage Notes (Signed)
Pt accompanied by sheriff's office. Pt reports auditory hallucinations over a little while now. Pt reports the voices are telling him to harm himself. Denies thought of hurting others. Pt denies visual hallucinations. He reports he stopped taking his medication a few years ago.

## 2021-02-15 NOTE — BH Assessment (Signed)
Comprehensive Clinical Assessment (CCA) Note  02/15/2021 Carlos Meyers [id number]  Disposition: Carlos Reasoner, NP, patient meets inpatient criteria. Per Carlos Meyers, no available beds at [facility]. Disposition SW to secure placement in the AM. Carlos Cruz, RN, informed of disposition.  The patient demonstrates the following risk factors for suicide: Chronic risk factors for suicide include: psychiatric disorder of depression and substance use disorder. Acute risk factors for suicide include: loss (financial, interpersonal, professional). Protective factors for this patient include: positive social support, responsibility to others (children, family), coping skills, and hope for the future. Considering these factors, the overall suicide risk at this point appears to be high. Patient is not appropriate for outpatient follow up.  Carlos Meyers ED from 02/15/2021 in [facility] Most recent reading at 02/15/2021 12:59 PM ED from 01/06/2021 in [facility] Most recent reading at 01/06/2021 11:22 AM ED from 01/06/2021 in [facility] Most recent reading at 01/06/2021  8:26 AM  C-SSRS RISK CATEGORY High Risk No Risk No Risk      Carlos Meyers is a 36 year old male presenting to [facility] due to [facility] with plan and hallucinations. Patient denied HI. Patient reported SI with plan to drive car off cliff or to find a family members gun. Patient denied having access to guns. Patient reported auditory hallucinations of command voices telling him to kill himself. Patient reports drinking alcohol, "I will drink after work, until I can't hear the voices". Patient reported onset 1-2 months ago. Patient reported stressors/triggers of SI include "not being able to do for my kids and paying bills". Patient reported worsening depressive symptoms. Patient sleeping 1-2 hours nightly, unable to stay asleep, and poor appetite. Patient reported 1  psych hospitalization approximately 5 years ago. Patient denied prior suicide attempts and self-harming behaviors.   Patient denied receiving any outpatient mental health services. Patient denied being prescribed psych medication. Patient reported history of being on psych medications years ago, patient unable to give timeframe. Patient reported diagnosis history of bipolar and schizophrenia.   Patient reported residing with girlfriend, 2 sons and a stepson. Patient reported having a 4 children, 2 boys and 2 girls, (3, 10, 11, 36). 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 Patient was cooperative during assessment. Patient unable to contract for safety and feels he needs inpatient treatment.   Chief Complaint:  Chief Complaint  Patient presents with   Hallucinations   Suicidal   Visit Diagnosis:  Major depressive disorder Psychosis unspecified   CCA Screening, Triage and Referral (STR)  Patient Reported Information How did you hear about [place]? Self  What Is the Reason for Your Visit/Call Today? SI with plan and Hallucinations  How Long Has This Been Causing You Problems? 1-6 months  What Do You Feel Would Help You the Most Today? Treatment for Depression or other mood problem   Have You Recently Had Any Thoughts About Hurting Yourself? Yes  Are You Planning to Commit Suicide/Harm Yourself At This time? Yes   Have you Recently Had Thoughts About Hurting Someone Carlos Meyers? No  Are You Planning to Harm Someone at This Time? No  Explanation: No data recorded  Have You Used Any Alcohol or Drugs in the Past 24 Hours? Yes  How Long Ago Did You Use Drugs or Alcohol? No data recorded What Did You Use and How Much? alcohol   Do You Currently Have a Therapist/Psychiatrist? No  Name of Therapist/Psychiatrist: No data recorded  Have You Been Recently Discharged From Any Office Practice or Programs? No  Explanation of Discharge From Practice/Program: No data  recorded    CCA Screening Triage Referral Assessment Type of Contact: Tele-Assessment  Telemedicine Service Delivery:   Is this Initial or Reassessment? Initial Assessment  Date Telepsych consult ordered in [facility]:  02/15/21  Time Telepsych consult ordered in [facility]:  1324  Location of Assessment: [facility] ED  Provider Location: [facility] Assessment Services   Collateral Involvement: none reported   Does Patient Have a [place]? No data recorded Name and Contact of Legal Guardian: No data recorded If Minor and Not Living with Parent(s), Who has Custody? No data recorded Is CPS involved or ever been involved? Never  Is APS involved or ever been involved? Never   Patient Determined To Be At Risk for Harm To Self or Others Based on Review of Patient Reported Information or Presenting Complaint? No data recorded Method: No data recorded Availability of Means: No data recorded Intent: No data recorded Notification Required: No data recorded Additional Information for Danger to Others Potential: No data recorded Additional Comments for Danger to Others Potential: No data recorded Are There Guns or Other Weapons in Your Home? No data recorded Types of Guns/Weapons: No data recorded Are These Weapons Safely Secured?                            No data recorded Who Could Verify You Are Able To Have These Secured: No data recorded Do You Have any Outstanding Charges, Pending Court Dates, Parole/Probation? No data recorded Contacted To Inform of Risk of Harm To Self or Others: No data recorded   Does Patient Present under Involuntary Commitment? No  IVC Papers Initial File Date: No data recorded  [place] of Residence: [place]   Patient Currently Receiving the Following Services: Not Receiving Services   Determination of Need: Emergent (2 hours)   Options For Referral: Inpatient Hospitalization; Medication Management; Outpatient Therapy     CCA  Biopsychosocial Patient Reported Schizophrenia/Schizoaffective Diagnosis in Past: No data recorded  Strengths: self-awareness   Mental Health Symptoms Depression:   Hopelessness; Increase/decrease in appetite; Worthlessness; Fatigue; Sleep (too much or little)   Duration of Depressive symptoms:  Duration of Depressive Symptoms: Greater than two weeks   Mania:   None   Anxiety:    Worrying; Tension; Sleep; Restlessness   Psychosis:   Hallucinations   Duration of Psychotic symptoms:  Duration of Psychotic Symptoms: Less than six months   Trauma:   -- (uta)   Obsessions:   None   Compulsions:   None   Inattention:   None   Hyperactivity/Impulsivity:  No data recorded  Oppositional/Defiant Behaviors:   None   Emotional Irregularity:   None   Other Mood/Personality Symptoms:  No data recorded   Mental Status Exam Appearance and self-care  Stature:   Average   Weight:   Average weight   Clothing:   Age-appropriate   Grooming:   Normal   Cosmetic use:   None   Posture/gait:   Normal   Motor activity:   Not Remarkable   Sensorium  Attention:   Normal   Concentration:   Normal   Orientation:   X5   Recall/memory:   Normal   Affect and Mood  Affect:   Depressed; Appropriate   Mood:   Depressed; Hopeless; Worthless   Relating  Eye contact:   Normal  Facial expression:   Depressed; Sad   Attitude toward examiner:   Cooperative   Thought and Language  Speech flow:  Clear and Coherent   Thought content:   Appropriate to Mood and Circumstances   Preoccupation:   None   Hallucinations:   Auditory; Command (Comment); Other (Comment) (voices telling patient to kill himself)   Organization:  No data recorded  [employer] of Knowledge:   Average   Intelligence:   Average   Abstraction:   Functional   Judgement:   Dangerous   Reality Testing:  No data recorded  Insight:   Fair   Decision  Making:   Impulsive   Social Functioning  Social Maturity:   Impulsive   Social Judgement:   Heedless   Stress  Stressors:   Family conflict; Transitions; Financial   Coping Ability:   Deficient supports; Exhausted; Overwhelmed   Skill Deficits:   Self-control; Decision making   Supports:   Family     Religion: Religion/Spirituality Are You A Religious Person?:  Special educational needs teacher)  Leisure/Recreation: Leisure / Recreation Do You Have Hobbies?: Yes Leisure and Hobbies: watching football  Exercise/Diet: Exercise/Diet Do You Exercise?:  (uta) Have You Gained or Lost A Significant Amount of Weight in the Past Six Months?:  (uta) Do You Follow a Special Diet?:  (uta) Do You Have Any Trouble Sleeping?: Yes Explanation of Sleeping Difficulties: 1-2 hours, hard to stay asleep   CCA Employment/Education Employment/Work Situation: Employment / Work Situation Employment Situation: Employed Work Stressors: none reported Patient's Job has Been Impacted by Current Illness: No  Education: Education Is Patient Currently Attending School?: No Last Grade Completed: 12 Did [place]?: No Did You Have An Individualized Education Program (IIEP):  (uta) Did You Have Any Difficulty At [employer]?:  [employer]) Patient's Education Has Been Impacted by Current Illness:  (uta)   CCA Family/Childhood History Family and Relationship History: Family history Marital status: Single Does patient have children?: Yes How many children?: 4 How is patient's relationship with their children?: good  Childhood History:  Childhood History By whom was/is the patient raised?: Mother Did patient suffer any verbal/emotional/physical/sexual abuse as a Meyers?: No Did patient suffer from severe childhood neglect?: No Has patient ever been sexually abused/assaulted/raped as an adolescent or adult?: No Was the patient ever a victim of a crime or a disaster?:  (uta) Witnessed domestic violence?:   (uta) Has patient been affected by domestic violence as an adult?:  Special educational needs teacher)  Meyers/Adolescent Assessment:     CCA Substance Use Alcohol/Drug Use: Alcohol / Drug Use Pain Medications: see MAR Prescriptions: see MAR Over the Counter: see MAR History of alcohol / drug use?: Yes Substance #1 Name of Substance 1: alcohol 1 - Age of First Use: uta 1 - Amount (size/oz): "until I stop hearing the voices in my head" 1 - Frequency: daily 1 - Last Use / Amount: uta                       ASAM's:  Six Dimensions of Multidimensional Assessment  Dimension 1:  Acute Intoxication and/or Withdrawal Potential:      Dimension 2:  Biomedical Conditions and Complications:      Dimension 3:  Emotional, Behavioral, or Cognitive Conditions and Complications:     Dimension 4:  Readiness to Change:     Dimension 5:  Relapse, Continued use, or Continued Problem Potential:     Dimension 6:  Recovery/Living Environment:  ASAM Severity Score:    ASAM Recommended Level of Treatment:     Substance use Disorder (SUD)    Recommendations for Services/Supports/Treatments:    Discharge Disposition:    DSM5 Diagnoses: Patient Active Problem List   Diagnosis Date Noted   Facial cellulitis 12/18/2012   Maxillary fracture ([place]) 12/18/2012     Referrals to Alternative Service(s): Referred to Alternative Service(s):   Place:   Date:   Time:    Referred to Alternative Service(s):   Place:   Date:   Time:    Referred to Alternative Service(s):   Place:   Date:   Time:    Referred to Alternative Service(s):   Place:   Date:   Time:     Venora Maples, [facility]

## 2021-02-15 NOTE — ED Notes (Signed)
Attempted to obtain blood, upon placing tube on Vacutainer the needle retracted. Pt will not allow another attempt.

## 2021-02-16 MED ORDER — LORAZEPAM 2 MG/ML IJ SOLN: 0.0000 mg | Freq: Two times a day (BID) | INTRAMUSCULAR | Status: DC

## 2021-02-16 MED ORDER — LORAZEPAM 1 MG PO TABS: 0.0000 mg | ORAL_TABLET | Freq: Two times a day (BID) | ORAL | Status: DC

## 2021-02-16 MED ORDER — THIAMINE HCL 100 MG PO TABS
100.0000 mg | ORAL_TABLET | Freq: Every day | ORAL | Status: DC
  Administered 2021-02-16 – 2021-02-17 (×2): 100 mg via ORAL
  Filled 2021-02-16 (×3): qty 1

## 2021-02-16 MED ORDER — LORAZEPAM 2 MG/ML IJ SOLN: 0.0000 mg | Freq: Four times a day (QID) | INTRAMUSCULAR | Status: DC

## 2021-02-16 MED ORDER — THIAMINE HCL 100 MG/ML IJ SOLN: 100.0000 mg | Freq: Every day | INTRAMUSCULAR | Status: DC

## 2021-02-16 MED ORDER — LORAZEPAM 1 MG PO TABS
0.0000 mg | ORAL_TABLET | Freq: Four times a day (QID) | ORAL | Status: DC
  Administered 2021-02-16: 1 mg via ORAL
  Filled 2021-02-16 (×2): qty 1

## 2021-02-16 NOTE — Progress Notes (Signed)
CSW spoke with Tammy Sours with [facility] in reference to this patient being reviewed for possible placement. Tammy Sours requested a UDS, assessment, and medical clear note. CSW notified nursing staff and willing review notes for information requested to send for further review.  Crissie Reese, MSW, LCSW-A, LCAS-A Phone: [phone] Disposition/TOC

## 2021-02-16 NOTE — Progress Notes (Signed)
CSW faxed UDS for further review at [facility], to be reviewed for possible placement of this patient.   Crissie Reese, MSW, LCSW-A, LCAS-A Phone: [phone] Disposition/TOC

## 2021-02-16 NOTE — Progress Notes (Signed)
Per Teodora Medici, patient meets criteria for inpatient treatment. There are no available or appropriate beds at [facility] today. CSW faxed referrals to the following facilities for review:  [facility]  Pending - No Request Sent N/A [address], [place] [address] [phone] --  [facility]  Pending - No Request Sent N/A [address], [place] [address] [phone] --  [facility]  Pending - No Request Sent N/A [address] [place] [address] [phone] --  [facility]  Pending - No Request Sent N/A [address] [place] [address] [phone] --  [facility]  Pending - No Request Sent N/A [address] [place] [address] [phone] --  [facility]  Pending - No Request Sent N/A [address] [place] [address] [phone] --  [facility]  Pending - No Request Sent N/A [address] [place], [place] [address] [phone] --  [facility]  Pending - No Request Sent N/A [address] [place] [address] [phone] --  [facility]  Pending - No Request Sent N/A [address], [place] [address] [phone] --  [facility]  Pending - No Request Sent N/A [address] [place] [address] [phone] --  [facility]  Pending - No Request Sent N/A [address] [place] [address] [phone] --  [facility]  Pending - No Request Sent N/A [address], [place] [address] [phone] --  [facility]  Pending - No Request Sent N/A [address] [place] [address] [phone] --  [facility]  Pending - No Request Sent N/A [address] [address] [place] [address] [phone] --  [facility]  Pending - No Request Sent N/A [address], [place] [address] [phone] --  [facility]  Pending - No Request Sent N/A [address] [place], [place] [address] [phone] --  [facility]  Pending - No Request Sent N/A [address] [place] [address] [phone] --    TTS will continue to seek bed placement.  Crissie Reese, MSW, LCSW-A, LCAS-A Phone: [phone] Disposition/TOC

## 2021-02-17 ENCOUNTER — Other Ambulatory Visit: Payer: Self-pay

## 2021-02-17 ENCOUNTER — Encounter (HOSPITAL_COMMUNITY): Payer: Self-pay | Admitting: Nurse Practitioner

## 2021-02-17 ENCOUNTER — Inpatient Hospital Stay (HOSPITAL_COMMUNITY)
Admission: AD | Admit: 2021-02-17 | Discharge: 2021-02-28 | DRG: 885 | Disposition: A | Discharge status: Home / Self Care | Payer: Federal, State, Local not specified - Other | Source: Intra-hospital | Attending: Emergency Medicine | Admitting: Emergency Medicine

## 2021-02-17 DIAGNOSIS — F251 Schizoaffective disorder, depressive type: Secondary | ICD-10-CM | POA: Diagnosis not present

## 2021-02-17 DIAGNOSIS — R45851 Suicidal ideations: Secondary | ICD-10-CM | POA: Diagnosis present

## 2021-02-17 DIAGNOSIS — F10239 Alcohol dependence with withdrawal, unspecified: Secondary | ICD-10-CM | POA: Diagnosis present

## 2021-02-17 DIAGNOSIS — F411 Generalized anxiety disorder: Secondary | ICD-10-CM | POA: Diagnosis present

## 2021-02-17 DIAGNOSIS — F17290 Nicotine dependence, other tobacco product, uncomplicated: Secondary | ICD-10-CM | POA: Diagnosis present

## 2021-02-17 DIAGNOSIS — Z814 Family history of other substance abuse and dependence: Secondary | ICD-10-CM | POA: Diagnosis not present

## 2021-02-17 DIAGNOSIS — Z818 Family history of other mental and behavioral disorders: Secondary | ICD-10-CM

## 2021-02-17 DIAGNOSIS — F333 Major depressive disorder, recurrent, severe with psychotic symptoms: Principal | ICD-10-CM | POA: Diagnosis present

## 2021-02-17 DIAGNOSIS — Z881 Allergy status to other antibiotic agents status: Secondary | ICD-10-CM

## 2021-02-17 DIAGNOSIS — Z634 Disappearance and death of family member: Secondary | ICD-10-CM

## 2021-02-17 DIAGNOSIS — I10 Essential (primary) hypertension: Secondary | ICD-10-CM | POA: Diagnosis present

## 2021-02-17 DIAGNOSIS — F4310 Post-traumatic stress disorder, unspecified: Secondary | ICD-10-CM | POA: Diagnosis present

## 2021-02-17 DIAGNOSIS — G4700 Insomnia, unspecified: Secondary | ICD-10-CM | POA: Diagnosis present

## 2021-02-17 DIAGNOSIS — E781 Pure hyperglyceridemia: Secondary | ICD-10-CM | POA: Diagnosis present

## 2021-02-17 DIAGNOSIS — F4010 Social phobia, unspecified: Secondary | ICD-10-CM | POA: Diagnosis present

## 2021-02-17 DIAGNOSIS — F29 Unspecified psychosis not due to a substance or known physiological condition: Secondary | ICD-10-CM | POA: Diagnosis present

## 2021-02-17 DIAGNOSIS — Z720 Tobacco use: Secondary | ICD-10-CM | POA: Diagnosis present

## 2021-02-17 LAB — RAPID URINE DRUG SCREEN, HOSP PERFORMED
Amphetamines: NOT DETECTED
Barbiturates: NOT DETECTED
Benzodiazepines: POSITIVE — AB
Cocaine: NOT DETECTED
Opiates: NOT DETECTED
Tetrahydrocannabinol: NOT DETECTED

## 2021-02-17 LAB — RESP PANEL BY RT-PCR (FLU A&B, COVID) ARPGX2
Influenza A by PCR: NEGATIVE
Influenza B by PCR: NEGATIVE
SARS Coronavirus 2 by RT PCR: NEGATIVE

## 2021-02-17 MED ORDER — MAGNESIUM HYDROXIDE 400 MG/5ML PO SUSP: 30.0000 mL | Freq: Every day | ORAL | Status: DC | PRN

## 2021-02-17 MED ORDER — TRAZODONE HCL 50 MG PO TABS
50.0000 mg | ORAL_TABLET | Freq: Every day | ORAL | Status: DC
  Administered 2021-02-17: 50 mg via ORAL
  Filled 2021-02-17 (×3): qty 1

## 2021-02-17 MED ORDER — THIAMINE HCL 100 MG PO TABS
100.0000 mg | ORAL_TABLET | Freq: Every day | ORAL | Status: DC
  Administered 2021-02-18 – 2021-02-28 (×11): 100 mg via ORAL
  Filled 2021-02-17 (×15): qty 1

## 2021-02-17 MED ORDER — ALUM & MAG HYDROXIDE-SIMETH 200-200-20 MG/5ML PO SUSP
30.0000 mL | ORAL | Status: DC | PRN
  Administered 2021-02-17: 30 mL via ORAL
  Filled 2021-02-17: qty 30

## 2021-02-17 MED ORDER — RISPERIDONE 1 MG PO TABS
1.0000 mg | ORAL_TABLET | Freq: Every day | ORAL | Status: DC
  Administered 2021-02-17: 1 mg via ORAL
  Filled 2021-02-17 (×3): qty 1

## 2021-02-17 MED ORDER — LORAZEPAM 2 MG/ML IJ SOLN: 0.0000 mg | Freq: Two times a day (BID) | INTRAMUSCULAR | Status: AC

## 2021-02-17 MED ORDER — ONDANSETRON 4 MG PO TBDP
4.0000 mg | ORAL_TABLET | Freq: Four times a day (QID) | ORAL | Status: AC | PRN
  Administered 2021-02-17 – 2021-02-19 (×3): 4 mg via ORAL
  Filled 2021-02-17 (×3): qty 1

## 2021-02-17 MED ORDER — ACETAMINOPHEN 325 MG PO TABS
650.0000 mg | ORAL_TABLET | Freq: Four times a day (QID) | ORAL | Status: DC | PRN
  Administered 2021-02-17 – 2021-02-23 (×5): 650 mg via ORAL
  Filled 2021-02-17 (×5): qty 2

## 2021-02-17 MED ORDER — LORAZEPAM 1 MG PO TABS: 1.0000 mg | ORAL_TABLET | ORAL | Status: DC | PRN

## 2021-02-17 MED ORDER — THIAMINE HCL 100 MG/ML IJ SOLN: 100.0000 mg | Freq: Every day | INTRAMUSCULAR | Status: DC

## 2021-02-17 MED ORDER — LOPERAMIDE HCL 2 MG PO CAPS: 2.0000 mg | ORAL_CAPSULE | ORAL | Status: AC | PRN

## 2021-02-17 MED ORDER — ZIPRASIDONE MESYLATE 20 MG IM SOLR: 20.0000 mg | Freq: Two times a day (BID) | INTRAMUSCULAR | Status: DC | PRN

## 2021-02-17 MED ORDER — LORAZEPAM 1 MG PO TABS
0.0000 mg | ORAL_TABLET | Freq: Two times a day (BID) | ORAL | Status: AC
  Administered 2021-02-18 – 2021-02-20 (×4): 1 mg via ORAL
  Filled 2021-02-17 (×4): qty 1

## 2021-02-17 MED ORDER — LORAZEPAM 2 MG/ML IJ SOLN: 0.0000 mg | Freq: Four times a day (QID) | INTRAMUSCULAR | Status: AC

## 2021-02-17 MED ORDER — LORAZEPAM 1 MG PO TABS
0.0000 mg | ORAL_TABLET | Freq: Four times a day (QID) | ORAL | Status: AC
  Administered 2021-02-17 (×2): 1 mg via ORAL
  Filled 2021-02-17 (×2): qty 1

## 2021-02-17 MED ORDER — HYDROXYZINE HCL 25 MG PO TABS
25.0000 mg | ORAL_TABLET | Freq: Three times a day (TID) | ORAL | Status: DC | PRN
  Administered 2021-02-18 – 2021-02-27 (×9): 25 mg via ORAL
  Filled 2021-02-17 (×9): qty 1

## 2021-02-17 NOTE — Progress Notes (Signed)
Psychoeducational Group Note  Date:  02/17/2021 Time:  2210  Group Topic/Focus:  Wrap-Up Group:   The focus of this group is to help patients review their daily goal of treatment and discuss progress on daily workbooks.  Participation Level: Did Not Attend  Participation Quality:  Not Applicable  Affect:  Not Applicable  Cognitive:  Not Applicable  Insight:  Not Applicable  Engagement in Group: Not Applicable  Additional Comments:  The patient did not attend the evening A.A.meeting.   Arti Trang S 02/17/2021, 11:10 PM

## 2021-02-17 NOTE — BH Assessment (Signed)
[facility] Assessment Progress Note  Per Caryn Bee, NP , this pt requires psychiatric hospitalization at this time.  Linsey, RN, [facility] has assigned pt to [facility] Rm 307-2 to the service of Dr [facility] Jim.  [facility] will be ready to receive pt between 13:00 and 13:30.  Pt has signed Voluntary Admission and Consent for Treatment, as well as Consent to Release Information to pt's wife, and signed forms have been faxed to [facility].  EDP Gwyneth Sprout, MD and pt's nurse, Waynetta Sandy, have been notified, and Waynetta Sandy agrees to send original paperwork along with pt via [employer], and to call report to [phone].  Doylene Canning, [place] Behavioral Health Coordinator [phone]

## 2021-02-17 NOTE — Progress Notes (Signed)
Pt was admitted to [facility] due to SI thoughts and AVH. Pt currently denies SI but pt stated that he hears voices constantly and that the voices tell him to kill himself. Pt stated that everything started to build up and then exploded. The voices do not tell him to hurt others. Pt has verbally contracted for safety. Pt sees people sometimes but "not as much as I hear voices." Pt drinks daily to "drown out the voices as much as I can." Pt stated that he drinks "a lot."  Pt stated that he was just tired of the voices and wanted to kill himself, along with the stress of "a lot of other things." Pt stated that he has had AVH for his whole life. Pt is suspicious of that people might do things to him. Pt was on Seroquel and Effexor when he was [age] years old. Pt was on those medications for 4-5 years. Pt mother stated that those worked well for him but he just stopped taking those medications. Pt states that one of the things he wants to work on is getting back on medications that work for him. Pt states that stressors include kids, bills, and work. 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 Pt has 4 kids, 2 boys and 2 girls (3,10,11,17). Pt currently lives with girlfriend and kids. Pt states he has an DWI from October 31st. Pt stated "I was just in so much stress and I just do what the voices say sometimes." Pt is pleasant and cooperative but has a very sad and depressed affect. Consents signed, handbook detailing the patient's rights, responsibilities, and visitor guidelines provided. Skin/belongings search completed and patient oriented to unit. Patient stable at this time. Patient given the opportunity to express concerns and ask questions. Patient given toiletries. Will continue to monitor.    02/17/21 1500  Psych Admission Type (Psych Patients Only)  Admission Status Voluntary  Psychosocial Assessment  Patient Complaints Agitation;Anxiety;Appetite decrease;Decreased  concentration;Confusion;Depression;Disorientation;Hopelessness;Irritability;Loneliness;Nervousness;Restlessness;Sadness;Self-harm thoughts;Substance abuse;Suspiciousness;Worrying  Eye Contact Brief  Facial Expression Sad;Sullen  Affect Depressed;Sad;Sullen  Speech Soft;Logical/coherent  Interaction Assertive;Guarded  Motor Activity Slow  Appearance/Hygiene In scrubs;Disheveled  Behavior Characteristics Cooperative;Calm;Guarded  Mood Sullen;Depressed;Sad  Thought Process  Coherency WDL  Content WDL  Delusions Paranoid  Perception Hallucinations  Hallucination Auditory;Visual  Judgment Impaired  Confusion None  Danger to Self  Current suicidal ideation? Denies  Danger to Others  Danger to Others None reported or observed

## 2021-02-17 NOTE — Hospital Course (Addendum)
Carlos Meyers is a 36 y.o. male presented to [facility] for concern of Carlos Meyers with plans of driving off a cliff or finding a family members' gun then admitted Involuntary to [facility] (02/17/2021) for treatment of MDD, PTSD exacerbated by EtOH. [place] [facility] [address]   Psychiatric diagnoses provided upon initial assessment: Principal Problem:   Major depressive disorder, recurrent episode, severe, with psychosis (Carlos Meyers) Active Problems:   Alcohol dependence with withdrawal (Carlos Meyers)   Tobacco abuse   PTSD (post-traumatic stress disorder)   Patient's psychiatric medications were adjusted on admission:  No home meds Started Prozac 10 mg daily for MDD and PTSD Started Seroquel 50 mg qHS for SSRI augmentation + AH 2/2 EtOH withdrawal  During the hospitalization, other adjustments were made to the patient's psychiatric medication regimen:  Increased Prozac 10 mg to 20 to 30mg  daily Titrated Seroquel 50 mg to 200 mg qHS Started Seroquel 25 mg daily for AH 2/2 EtOH withdrawal, with plans to dc once AH resolved Discontinued daytime dose on 1/31 Started Gabapentin 300 mg BID, and increased to TID for EtOH withdrawal and   Psychiatric medications to be continued upon discharge: Prozac 30mg  daily Seroquel 200mg  qhs Gabapentin 300mg  TID  Events during hospitalizations: Patient learned of a very close friend, like a brother, who had been adopted by the patient's aunt, being killed in town mid-way through this admission. Normal grief reaction described of hearing his friend's voice saying the patient's name.   FOLLOW-UP Considerations: Labs: Liver function panel to ensure appropriate normalization of ALT, which was down trending to 63. Also follow lipid panel with PCP for hypertriglyceridemia. Referrals: [place] maintenance: Consider hep B vaccine (negative Hep B surface antigen antibodies on 2/1 hepatitis labs, which were  all non-reactive). Ensure appropriate outpatient resources for alcohol use disorder and maintaining sobriety.

## 2021-02-17 NOTE — ED Notes (Signed)
Patient DC d off unit to [facility]. Patient calm, cooperative, no s/s of distress.   DC information and belongings given to [facility] for transportation to facility .  Patient ambulatory off unit, escorted by RN. Patient transported by [employer].

## 2021-02-17 NOTE — Tx Team (Signed)
Initial Treatment Plan 02/17/2021 4:31 PM Carlos Meyers [id number]    PATIENT STRESSORS: Financial difficulties   Medication change or noncompliance   Substance abuse     PATIENT STRENGTHS: Capable of independent living  Electronics engineer  Motivation for treatment/growth  Supportive family/friends  Work skills    PATIENT IDENTIFIED PROBLEMS: Depression   Hearing voices constantly telling him to kill himself  Sometimes visual hallucinations   SI thoughts  [place]  Work   Medications     DISCHARGE CRITERIA:  Improved stabilization in mood, thinking, and/or behavior Motivation to continue treatment in a less acute level of care Withdrawal symptoms are absent or subacute and managed without 24-hour nursing intervention  PRELIMINARY DISCHARGE PLAN: Attend PHP/IOP Outpatient therapy Return to previous living arrangement Return to previous work or school arrangements  PATIENT/FAMILY INVOLVEMENT: This treatment plan has been presented to and reviewed with the patient, Carlos Meyers.  The patient has been given the opportunity to ask questions and make suggestions.  Gerrianne Scale, RN 02/17/2021, 4:31 PM

## 2021-02-18 DIAGNOSIS — Z720 Tobacco use: Secondary | ICD-10-CM | POA: Diagnosis present

## 2021-02-18 DIAGNOSIS — F251 Schizoaffective disorder, depressive type: Secondary | ICD-10-CM

## 2021-02-18 DIAGNOSIS — F333 Major depressive disorder, recurrent, severe with psychotic symptoms: Secondary | ICD-10-CM | POA: Diagnosis present

## 2021-02-18 DIAGNOSIS — F10239 Alcohol dependence with withdrawal, unspecified: Secondary | ICD-10-CM | POA: Diagnosis present

## 2021-02-18 LAB — LIPID PANEL
Cholesterol: 183 mg/dL (ref 0–200)
HDL: 60 mg/dL (ref >40)
LDL Cholesterol: 75 mg/dL (ref 0–99)
Total CHOL/HDL Ratio: 3.1 RATIO
Triglycerides: 240 mg/dL — ABNORMAL HIGH (ref <150)
VLDL: 48 mg/dL — ABNORMAL HIGH (ref 0–40)

## 2021-02-18 LAB — HEMOGLOBIN A1C
Hgb A1c MFr Bld: 5.5 % (ref 4.8–5.6)
Mean Plasma Glucose: 111 mg/dL

## 2021-02-18 LAB — TSH: TSH: 1.384 u[IU]/mL (ref 0.350–4.500)

## 2021-02-18 MED ORDER — QUETIAPINE FUMARATE 25 MG PO TABS
25.0000 mg | ORAL_TABLET | Freq: Three times a day (TID) | ORAL | Status: DC | PRN
  Administered 2021-02-20 – 2021-02-26 (×5): 25 mg via ORAL
  Filled 2021-02-18 (×3): qty 1

## 2021-02-18 MED ORDER — QUETIAPINE FUMARATE 25 MG PO TABS: 25.0000 mg | ORAL_TABLET | Freq: Three times a day (TID) | ORAL | Status: DC | PRN

## 2021-02-18 MED ORDER — NICOTINE 14 MG/24HR TD PT24
14.0000 mg | MEDICATED_PATCH | Freq: Every day | TRANSDERMAL | Status: DC
  Administered 2021-02-18 – 2021-02-22 (×4): 14 mg via TRANSDERMAL
  Filled 2021-02-18 (×10): qty 1

## 2021-02-18 MED ORDER — FLUOXETINE HCL 10 MG PO CAPS
10.0000 mg | ORAL_CAPSULE | Freq: Every day | ORAL | Status: DC
  Administered 2021-02-18 – 2021-02-20 (×3): 10 mg via ORAL
  Filled 2021-02-18 (×6): qty 1

## 2021-02-18 MED ORDER — KETOCONAZOLE 2 % EX CREA
TOPICAL_CREAM | Freq: Two times a day (BID) | CUTANEOUS | Status: DC
  Administered 2021-02-18 (×2): 1 via TOPICAL
  Filled 2021-02-18: qty 15

## 2021-02-18 MED ORDER — MIRTAZAPINE 15 MG PO TABS
7.5000 mg | ORAL_TABLET | Freq: Every evening | ORAL | Status: DC | PRN
  Administered 2021-02-21 – 2021-02-22 (×2): 7.5 mg via ORAL
  Filled 2021-02-18 (×3): qty 1

## 2021-02-18 MED ORDER — QUETIAPINE FUMARATE 50 MG PO TABS
50.0000 mg | ORAL_TABLET | Freq: Every day | ORAL | Status: DC
  Administered 2021-02-18: 21:00:00 50 mg via ORAL
  Filled 2021-02-18 (×3): qty 1

## 2021-02-18 NOTE — Group Note (Signed)
Recreation Therapy Group Note   Group Topic:Animal Assisted Therapy   Group Date: 02/18/2021 Start Time: 1430 End Time: 1515 Facilitators: Victorino Sparrow, LRT,CTRS Location: [place]   AAA/T Program Assumption of Risk Form signed by Patient/ or Parent Legal Guardian YES  Patient understands their participation is voluntary YES   Group Description: Patients provided opportunity to interact with trained and credentialed [employer] Therapy dog and the community volunteer/dog handler. Patients practiced appropriate animal interaction and were educated on dog safety outside of the hospital in common community settings. Patients were allowed to use dog toys and other items to practice commands, engage the dog in play, and/or complete routine aspects of animal care.    Affect/Mood: N/A   Participation Level: Did not attend    Clinical Observations/Individualized Feedback:     Plan: Continue to engage patient in RT group sessions 2-3x/week.   Victorino Sparrow, Glennis Brink 02/18/2021 3:29 PM

## 2021-02-18 NOTE — Group Note (Signed)
Date:  02/18/2021 Time:  9:38 AM  Group Topic/Focus:  Goals Group:   The focus of this group is to help patients establish daily goals to achieve during treatment and discuss how the patient can incorporate goal setting into their daily lives to aide in recovery.    Participation Level:  Did Not Attend  Margaret Pyle 02/18/2021, 9:38 AM
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
## 2021-02-18 NOTE — Progress Notes (Signed)
The patient rated his day as a 8 out of 10 since he wants to keep to himself. His goal for tomorrow is to "get back to self". He did not elaborate any further.

## 2021-02-18 NOTE — Plan of Care (Signed)
Nurse discussed coping skills, anxiety and depression with  patient.  

## 2021-02-18 NOTE — Progress Notes (Addendum)
D:  Patient's self inventory sheet, patient has poor sleep, sleep medication helpful.  Fair appetite, normal energy level, fair concentration.  Rated depression, anxiety and hopeless #8.  Withdrawal from alcohol, tremors, hot flashes.  SI, no plan, contracts for safety.  Headaches.  Goal is be myself again, plans to feel better.  No discharge plans. A:  Medications administered per MD orders.  Emotional support and encouragement given patient. R:  Denied HI, no plan, contracts for safety.   SI, no plan, contracts for safety.  Does hear voices to hurt himself.  Does see people, shadows.  Safety maintained with 15 minute checks.
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
## 2021-02-18 NOTE — Progress Notes (Signed)
°   02/17/21 2140  Psych Admission Type (Psych Patients Only)  Admission Status Voluntary  Psychosocial Assessment  Patient Complaints Anxiety;Depression;Other (Comment);Sleep disturbance (detox alcohol)  Eye Contact Brief  Facial Expression Sad;Sullen;Anxious  Affect Depressed;Sad;Sullen  Speech Soft;Logical/coherent  Interaction Assertive  Motor Activity Slow  Appearance/Hygiene In scrubs;Disheveled  Behavior Characteristics Cooperative;Anxious  Mood Anxious;Depressed;Sad  Thought Process  Coherency WDL  Content WDL  Delusions None reported or observed  Perception Hallucinations  Hallucination Auditory;Visual;Command (command auditory hallucinations and seeing people who want him to come where they are)  Judgment Impaired  Confusion None  Danger to Self  Current suicidal ideation? Denies (self-harm thoughts earlier - contracts for safety)  Danger to Others  Danger to Others None reported or observed   Pt seen at nurse's station. Pt denies SI although he had self-harm thoughts earlier in the day. Pt contracts for safety. Pt denies HI. Pt endorses AVH - sees people who are beckoning him to come where they are. He also hears voices that tell him to hurt himself. Pt rates pain 7/10 as headache pain and 7/10 as stomach pain/indigestion. Provider notified and appropriate PRNs ordered. Pt rates anxiety 9/10 and depression 10/10. Pt states last drink was 2 days ago. Endorses the following withdrawal symptoms: sweats, headache, nausea, anxiety. Pt also has a rash on his skin. It is covered in toothpaste right now because pt says this stops the itching. Pt says the rash keeps coming back despite being given antibiotics and cream. Pt requesting something for the rash and scabby areas but cannot remember the name of the cream or his condition. Pt endorses poor sleep lately. Pt CIWA =10 this evening.
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
## 2021-02-18 NOTE — [facility] Suicide Risk Assessment (Signed)
[facility] INPATIENT:  Family/Significant Other Suicide Prevention Education  Suicide Prevention Education:  Education Completed; Devoria Glassing,  (girlfriend) [phone] (name of family member/significant other) has been identified by the patient as the family member/significant other with whom the patient will be residing, and identified as the person(s) who will aid the patient in the event of a mental health crisis (suicidal ideations/suicide attempt).  With written consent from the patient, the family member/significant other has been provided the following suicide prevention education, prior to the and/or following the discharge of the patient.  CSW spoke with patient girlfriend who reports she has noticed a decline in patient mental health the past couple of months.  Patient has lossed some weight and had increasingly more suicidal thoughts.  Patient is unable to focu and drinks heavy.  Girlfriend reports that when he drinks he can become more verbally aggressive and will have more outbursts.  Girlfriend reports taht he drinks daily.  There are no guns/weapons in the home but patient does not know if he would have access to a gun through friends.  Girlfriend also reports that he has a Land and CSW encouraged that patient should call PO officer to let them know where he is at. Girlfriend also provided email address so she can send FMLA paperwork for patient. Girlfriend reports that patient had mental health issues as an adolescent and a young adult but to her knowledge has not been on meds since the age of 55. Girlfriend believes patient needs to be stabilized on medications and get connected to follow up care.   The suicide prevention education provided includes the following: Suicide risk factors Suicide prevention and interventions National Suicide Hotline telephone number [facility] assessment telephone number [facility] Emergency Assistance 911 [facility] and/or  Residential Mobile Crisis Unit telephone number  Request made of family/significant other to: Remove weapons (e.g., guns, rifles, knives), all items previously/currently identified as safety concern.   Remove drugs/medications (over-the-counter, prescriptions, illicit drugs), all items previously/currently identified as a safety concern.  The family member/significant other verbalizes understanding of the suicide prevention education information provided.  The family member/significant other agrees to remove the items of safety concern listed above.  Kalasia Crafton E Janece Laidlaw 02/18/2021, 3:34 PM

## 2021-02-19 ENCOUNTER — Encounter (HOSPITAL_COMMUNITY): Payer: Self-pay

## 2021-02-19 DIAGNOSIS — F251 Schizoaffective disorder, depressive type: Secondary | ICD-10-CM | POA: Diagnosis not present

## 2021-02-19 MED ORDER — MELATONIN 3 MG PO TABS
3.0000 mg | ORAL_TABLET | Freq: Every day | ORAL | Status: DC
  Administered 2021-02-19 – 2021-02-24 (×6): 3 mg via ORAL
  Filled 2021-02-19 (×9): qty 1

## 2021-02-19 MED ORDER — QUETIAPINE FUMARATE 25 MG PO TABS
25.0000 mg | ORAL_TABLET | Freq: Every day | ORAL | Status: DC
  Administered 2021-02-19 – 2021-02-25 (×7): 25 mg via ORAL
  Filled 2021-02-19 (×12): qty 1

## 2021-02-19 MED ORDER — QUETIAPINE FUMARATE 100 MG PO TABS
100.0000 mg | ORAL_TABLET | Freq: Every day | ORAL | Status: DC
  Administered 2021-02-19 – 2021-02-22 (×4): 100 mg via ORAL
  Filled 2021-02-19 (×7): qty 1

## 2021-02-19 MED ORDER — HYDROCORTISONE 1 % EX CREA
TOPICAL_CREAM | Freq: Two times a day (BID) | CUTANEOUS | Status: DC
  Administered 2021-02-19 – 2021-02-26 (×2): 1 via TOPICAL
  Filled 2021-02-19 (×2): qty 28
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
## 2021-02-19 NOTE — Group Note (Signed)
LCSW Group Therapy Note ° ° °Group Date: 02/19/2021 °Start Time: 1300 °End Time: 1400 ° °Type of Therapy and Topic:  Group Therapy - Healthy vs Unhealthy Coping Skills ° °Participation Level:  Active  ° °Description of Group °The focus of this group was to determine what unhealthy coping techniques typically are used by group members and what healthy coping techniques would be helpful in coping with various problems. Patients were guided in becoming aware of the differences between healthy and unhealthy coping techniques. Patients were asked to identify 2-3 healthy coping skills they would like to learn to use more effectively. ° °Therapeutic Goals °Patients learned that coping is what human beings do all day long to deal with various situations in their lives °Patients defined and discussed healthy vs unhealthy coping techniques °Patients identified their preferred coping techniques and identified whether these were healthy or unhealthy °Patients determined 2-3 healthy coping skills they would like to become more familiar with and use more often. °Patients provided support and ideas to each other ° ° °Summary of Patient Progress:  During group, patient expressed understanding of the topic being discussed. Pt actively engaged in identifying unhealthy coping mechanisms utilized in the past.  Pt actively engaged in processing means of coping and what outcomes occur from such methods. Pt further engaged in discussion, identifying healthy coping mechanisms.  Pt engaged in processing the use of healthier mechanisms and how these produce different gains to unhealthy mechanisms. Pt actively identified other coping mechanisms they would be willing to try in the future. Pt proved receptive of alternate group members input and feedback from CSW. ° ° °Therapeutic Modalities °Cognitive Behavioral Therapy °Motivational Interviewing ° °Carlos Meyers, LCSWA °02/19/2021  2:13 PM   ° °

## 2021-02-19 NOTE — BH IP Treatment Plan (Signed)
Interdisciplinary Treatment and Diagnostic Plan Update  02/19/2021 Time of Session: 9:20am  Carlos Meyers MRN: [id number]  Principal Diagnosis: Major depressive disorder, recurrent episode, severe, with psychosis ([place])  Secondary Diagnoses: Principal Problem:   Major depressive disorder, recurrent episode, severe, with psychosis ([place]) Active Problems:   Psychosis ([place])   Alcohol dependence with withdrawal ([place])   Tobacco abuse   Current Medications:  Current Facility-Administered Medications  Medication Dose Route Frequency Provider Last Rate Last Admin   acetaminophen (TYLENOL) tablet 650 mg  650 mg Oral Q6H PRN Suella Broad, FNP   650 mg at 02/18/21 1631   alum & mag hydroxide-simeth (MAALOX/MYLANTA) 200-200-20 MG/5ML suspension 30 mL  30 mL Oral Q4H PRN Suella Broad, FNP   30 mL at 02/17/21 2220   FLUoxetine (PROZAC) capsule 10 mg  10 mg Oral Daily Merrily Brittle, DO   10 mg at 02/19/21 8127   hydrocortisone cream 1 %   Topical BID Merrily Brittle, DO       hydrOXYzine (ATARAX) tablet 25 mg  25 mg Oral TID PRN Suella Broad, FNP   25 mg at 02/19/21 5170   loperamide (IMODIUM) capsule 2-4 mg  2-4 mg Oral PRN Ajibola, Ene A, NP       LORazepam (ATIVAN) injection 0-4 mg  0-4 mg Intravenous Q12H Starkes-Perry, Gayland Curry, FNP       Or   LORazepam (ATIVAN) tablet 0-4 mg  0-4 mg Oral Q12H Suella Broad, FNP   1 mg at 02/19/21 1120   ziprasidone (GEODON) injection 20 mg  20 mg Intramuscular Q12H PRN Starkes-Perry, Gayland Curry, FNP       And   LORazepam (ATIVAN) tablet 1 mg  1 mg Oral PRN Starkes-Perry, Gayland Curry, FNP       magnesium hydroxide (MILK OF MAGNESIA) suspension 30 mL  30 mL Oral Daily PRN Suella Broad, FNP       melatonin tablet 3 mg  3 mg Oral QHS Merrily Brittle, DO       mirtazapine (REMERON) tablet 7.5 mg  7.5 mg Oral QHS PRN Hill, Jackie Plum, MD       nicotine (NICODERM CQ - dosed in mg/24 hours) patch 14 mg  14 mg Transdermal  Daily Merrily Brittle, DO   14 mg at 02/19/21 0815   ondansetron (ZOFRAN-ODT) disintegrating tablet 4 mg  4 mg Oral Q6H PRN Ajibola, Ene A, NP   4 mg at 02/19/21 0174   QUEtiapine (SEROQUEL) tablet 100 mg  100 mg Oral QHS Merrily Brittle, DO       QUEtiapine (SEROQUEL) tablet 25 mg  25 mg Oral TID PRN Merrily Brittle, DO       QUEtiapine (SEROQUEL) tablet 25 mg  25 mg Oral Q0600 Merrily Brittle, DO   25 mg at 02/19/21 1120   thiamine tablet 100 mg  100 mg Oral Daily Suella Broad, FNP   100 mg at 02/19/21 9449   Or   thiamine (B-1) injection 100 mg  100 mg Intravenous Daily Suella Broad, FNP       PTA Medications: No medications prior to admission.    Patient Stressors: Financial difficulties   Medication change or noncompliance   Substance abuse    Patient Strengths: Capable of independent living  Electronics engineer  Motivation for treatment/growth  Supportive family/friends  Work skills   Treatment Modalities: Medication Management, Group therapy, Case management,  1 to 1 session with clinician, Psychoeducation, Recreational  therapy.   Physician Treatment Plan for Primary Diagnosis: Major depressive disorder, recurrent episode, severe, with psychosis ([place]) Long Term Goal(s):     Short Term Goals:    Medication Management: Evaluate patient's response, side effects, and tolerance of medication regimen.  Therapeutic Interventions: 1 to 1 sessions, Unit Group sessions and Medication administration.  Evaluation of Outcomes: Not Met  Physician Treatment Plan for Secondary Diagnosis: Principal Problem:   Major depressive disorder, recurrent episode, severe, with psychosis ([place]) Active Problems:   Psychosis ([place])   Alcohol dependence with withdrawal ([place])   Tobacco abuse  Long Term Goal(s):     Short Term Goals:       Medication Management: Evaluate patient's response, side effects, and tolerance of medication regimen.  Therapeutic  Interventions: 1 to 1 sessions, Unit Group sessions and Medication administration.  Evaluation of Outcomes: Not Met   RN Treatment Plan for Primary Diagnosis: Major depressive disorder, recurrent episode, severe, with psychosis ([place]) Long Term Goal(s): Knowledge of disease and therapeutic regimen to maintain health will improve  Short Term Goals: Ability to remain free from injury will improve, Ability to participate in decision making will improve, Ability to verbalize feelings will improve, Ability to disclose and discuss suicidal ideas, and Ability to identify and develop effective coping behaviors will improve  Medication Management: RN will administer medications as ordered by provider, will assess and evaluate patient's response and provide education to patient for prescribed medication. RN will report any adverse and/or side effects to prescribing provider.  Therapeutic Interventions: 1 on 1 counseling sessions, Psychoeducation, Medication administration, Evaluate responses to treatment, Monitor vital signs and CBGs as ordered, Perform/monitor CIWA, COWS, AIMS and Fall Risk screenings as ordered, Perform wound care treatments as ordered.  Evaluation of Outcomes: Not Met   LCSW Treatment Plan for Primary Diagnosis: Major depressive disorder, recurrent episode, severe, with psychosis ([place]) Long Term Goal(s): Safe transition to appropriate next level of care at discharge, Engage patient in therapeutic group addressing interpersonal concerns.  Short Term Goals: Engage patient in aftercare planning with referrals and resources, Increase social support, Increase emotional regulation, Facilitate acceptance of mental health diagnosis and concerns, Identify triggers associated with mental health/substance abuse issues, and Increase skills for wellness and recovery  Therapeutic Interventions: Assess for all discharge needs, 1 to 1 time with Social worker, Explore available resources and support  systems, Assess for adequacy in community support network, Educate family and significant other(s) on suicide prevention, Complete Psychosocial Assessment, Interpersonal group therapy.  Evaluation of Outcomes: Not Met   Progress in Treatment: Attending groups: Yes. Participating in groups: Yes. Taking medication as prescribed: Yes. Toleration medication: Yes. Family/Significant other contact made: Yes, individual(s) contacted:  Girlfriend and Mother  Patient understands diagnosis: Yes. Discussing patient identified problems/goals with staff: Yes. Medical problems stabilized or resolved: Yes. Denies suicidal/homicidal ideation: Yes. Issues/concerns per patient self-inventory: No.   New problem(s) identified: No, Describe:  None   New Short Term/Long Term Goal(s): medication stabilization, elimination of SI thoughts, development of comprehensive mental wellness plan.   Patient Goals: "To get better"  Discharge Plan or Barriers: Patient recently admitted. CSW will continue to follow and assess for appropriate referrals and possible discharge planning.   Reason for Continuation of Hospitalization: Depression Hallucinations Medication stabilization Suicidal ideation  Estimated Length of Stay: 3 to 5 days    Scribe for Treatment Team: Carney Harder 02/19/2021 2:59 PM

## 2021-02-19 NOTE — [facility] Group Notes (Signed)
Patient did not attend Orientation group.  °

## 2021-02-19 NOTE — Progress Notes (Signed)
°   02/19/21 2300  Psych Admission Type (Psych Patients Only)  Admission Status Voluntary  Psychosocial Assessment  Patient Complaints Anxiety;Depression  Eye Contact Fair  Facial Expression Sad;Anxious;Sullen  Affect Anxious;Depressed;Sad  Speech Logical/coherent;Soft  Interaction Assertive  Motor Activity Slow  Appearance/Hygiene Unremarkable  Behavior Characteristics Cooperative;Anxious  Mood Anxious  Thought Process  Coherency WDL  Content Blaming self;WDL  Delusions None reported or observed  Perception Hallucinations  Hallucination Auditory;Visual;Command  Judgment Impaired  Confusion None  Danger to Self  Current suicidal ideation? Denies  Self-Injurious Behavior No self-injurious ideation or behavior indicators observed or expressed   Agreement Not to Harm Self Yes  Description of Agreement contracts for safety verbal  Danger to Others  Danger to Others None reported or observed   Patient compliant with medications denies SI/HI/A/VH and verbally contracted for safety. Will  continue to monitor.   Support and encouragement provided. No adverse drug noted.

## 2021-02-19 NOTE — Group Note (Signed)
Recreation Therapy Group Note   Group Topic:Stress Management  Group Date: 02/19/2021 Start Time: 0930 End Time: 1000 Facilitators: Caroll Rancher, LRT,CTRS Location: [address] Dayroom   Goal Area(s) Addresses:  Patient will actively participate in stress management techniques presented during session.  Patient will successfully identify benefit of practicing stress management post d/c.   Group Description:  Guided Imagery. LRT provided education, instruction, and demonstration on practice of visualization via guided imagery. Patient was asked to participate in the technique introduced during session. LRT debriefed including topics of mindfulness, stress management and specific scenarios each patient could use these techniques. Patients were given suggestions of ways to access scripts post d/c and encouraged to explore Youtube and other apps available on smartphones, tablets, and computers.   Clinical Observations/Individualized Feedback: Unable to do group due to MHT group going over time.     Plan: Continue to engage patient in RT group sessions 2-3x/week.   Caroll Rancher, Antonietta Jewel 02/19/2021 12:33 PM

## 2021-02-19 NOTE — Progress Notes (Signed)
Pt denies SI/HI/AVH and verbally agrees to approach staff if these become apparent or before harming themselves/others. Rates depression 7/10. Rates anxiety 4/10. Rates pain 0/10. Pt has been in his room for most of the day. Pt became agitated when he found out he was moving so RN adjusted and did not move him. Pt stated that he has had a rough day because his birthday is tomorrow and he is not able to be with his kids. Pt states that he misses his kids. Scheduled medications administered to pt, per MD orders. RN provided support and encouragement to pt. Q15 min safety checks implemented and continued. Pt safe on the unit. RN will continue to monitor and intervene as needed.   02/19/21 0816  Psych Admission Type (Psych Patients Only)  Admission Status Voluntary  Psychosocial Assessment  Patient Complaints Anxiety;Depression  Eye Contact Fair  Facial Expression Sad;Anxious;Sullen  Affect Anxious;Depressed;Sad  Speech Logical/coherent;Soft  Interaction Assertive  Motor Activity Slow  Appearance/Hygiene Unremarkable  Behavior Characteristics Cooperative;Anxious  Mood Depressed;Anxious;Sad;Sullen  Thought Process  Coherency WDL  Content Blaming self;WDL  Delusions None reported or observed  Perception Hallucinations  Hallucination Auditory;Visual;Command  Judgment Impaired  Confusion None  Danger to Self  Current suicidal ideation? Denies  Self-Injurious Behavior No self-injurious ideation or behavior indicators observed or expressed   Agreement Not to Harm Self Yes  Description of Agreement contracts for safety verbal  Danger to Others  Danger to Others None reported or observed

## 2021-02-19 NOTE — Progress Notes (Addendum)
°   02/18/21 1955  Psych Admission Type (Psych Patients Only)  Admission Status Voluntary  Psychosocial Assessment  Patient Complaints Anxiety;Depression;Substance abuse  Eye Contact Fair  Facial Expression Sad;Sullen;Anxious  Affect Depressed;Sad;Sullen  Speech Soft;Logical/coherent  Interaction Assertive  Motor Activity Slow  Appearance/Hygiene Unremarkable  Behavior Characteristics Cooperative;Anxious;Fidgety  Mood Depressed;Anxious  Thought Process  Coherency Concrete thinking  Content WDL;Blaming self  Delusions None reported or observed  Hallucination Auditory;Command (still hearing voices that tell him "What are you doing?" - voices not as loud today)  Judgment Impaired  Confusion None  Danger to Self  Current suicidal ideation? Denies  Agreement Not to Harm Self Yes  Description of Agreement contracts for safety verbal  Danger to Others  Danger to Others None reported or observed   Pt denies SI but says he had self-harm thoughts earlier. Contracts for safety. Pt denies HI and VH but endorses AH. "The voices are telling me, 'What are you doing?' when I do things. They are lower today." Pt states he is not seeing things, but also says that he sees people in the halls. "Did someone die here on the [address] hall? I see people here and sometimes I can feel good and bad energy in this place." Pt rates anxiety 7/10 and depression 8/10. Pt worried because his kids cannot visit. He says he misses them. "I know I have to get myself right. The last time I saw them (his family) they told me, 'this is not you.'" Pt is tearful. Pt encouraged to use this time to focus on himself so he can do better for his family. Pt states that some medications he used to take made him feel numb inside. Pt again encouraged to report any side effects that would decrease compliance after discharge. Pt asked for a list of his medications so he could tell his mother what he is taking. Pt started on Seroquel today at his  request. Pt given this medication tonight. Encouraged to voice if he has trouble sleeping as there is PRN medication for that if Seroquel is not enough. Pt verbalized understanding.  Pt endorsed following withdrawal symptoms: nausea, sweats, anxiety, agitation. CIWA=6.

## 2021-02-20 DIAGNOSIS — F251 Schizoaffective disorder, depressive type: Secondary | ICD-10-CM | POA: Diagnosis not present

## 2021-02-20 MED ORDER — GABAPENTIN 300 MG PO CAPS
300.0000 mg | ORAL_CAPSULE | Freq: Two times a day (BID) | ORAL | Status: DC
  Administered 2021-02-20 – 2021-02-23 (×6): 300 mg via ORAL
  Filled 2021-02-20 (×11): qty 1

## 2021-02-20 MED ORDER — LORAZEPAM 1 MG PO TABS: 1.0000 mg | ORAL_TABLET | Freq: Four times a day (QID) | ORAL | Status: AC | PRN

## 2021-02-20 MED ORDER — LORAZEPAM 0.5 MG PO TABS
0.5000 mg | ORAL_TABLET | Freq: Two times a day (BID) | ORAL | Status: DC
  Administered 2021-02-21: 0.5 mg via ORAL
  Filled 2021-02-20: qty 1

## 2021-02-20 MED ORDER — LORAZEPAM 1 MG PO TABS
1.0000 mg | ORAL_TABLET | Freq: Every day | ORAL | Status: AC
  Administered 2021-02-20: 1 mg via ORAL
  Filled 2021-02-20: qty 1

## 2021-02-20 MED ORDER — FLUOXETINE HCL 20 MG PO CAPS
20.0000 mg | ORAL_CAPSULE | Freq: Every day | ORAL | Status: DC
  Administered 2021-02-21 – 2021-02-26 (×6): 20 mg via ORAL
  Filled 2021-02-20 (×7): qty 1

## 2021-02-20 NOTE — Progress Notes (Signed)
Carlos Meyers [phone] of [employer] presented in lobby today, requesting that he be notified prior to release of this patient.   Other contact number provided: Logan H. [phone] of [employer].

## 2021-02-20 NOTE — Group Note (Signed)
Date:  02/20/2021 Time:  11:26 AM  Group Topic/Focus:  Goals Group:   The focus of this group is to help patients establish daily goals to achieve during treatment and discuss how the patient can incorporate goal setting into their daily lives to aide in recovery.    Participation Level:  Did Not Attend  Participation Quality:  Supportive and    Affect:    Cognitive:      Insight: None  Engagement in Group:    Modes of Intervention:      Additional Comments:    Reymundo Poll 02/20/2021, 11:26 AM

## 2021-02-20 NOTE — Progress Notes (Signed)
Psychoeducational Group Note  Date:  02/20/2021 Time:  2058  Group Topic/Focus:  Wrap-Up Group:   The focus of this group is to help patients review their daily goal of treatment and discuss progress on daily workbooks.  Participation Level: Did Not Attend  Participation Quality:  Not Applicable  Affect:  Not Applicable  Cognitive:  Not Applicable  Insight:  Not Applicable  Engagement in Group: Not Applicable  Additional Comments:  The patient did not attend group this evening.   Hazle Coca S 02/20/2021, 8:58 PM

## 2021-02-20 NOTE — Progress Notes (Signed)
Pt denies SI/HI/AVH and verbally agrees to approach staff if these become apparent or before harming themselves/others. Rates depression 8-10/10. Rates anxiety 3/10. Rates pain 0/10.  Pt birthday is today and has been isolative in room. Pt stated, "I just want to be left alone." Pt told MHT later in the day that he was having SI thoughts and that he did not have a plan or intent and would tell us if he had a plan. RN went to talk to pt and the pt stated that he no longer had those thoughts and would tell us if he did. He stated that he was having a hard time because he feels like he should be with his kids, especially because it is his birthday. Pt was given a cupcake and pt stated that it meant a lot to him. Pt smiled for a second. Pt has been in the day room more but stated that he has always been the type of person that has been to himself. Scheduled medications administered to pt, per MD orders. RN provided support and encouragement to pt. Q15 min safety checks implemented and continued. Pt safe on the unit. RN will continue to monitor and intervene as needed.   02/20/21 0805  Psych Admission Type (Psych Patients Only)  Admission Status Voluntary  Psychosocial Assessment  Patient Complaints Anxiety;Depression  Eye Contact Brief  Facial Expression Anxious;Sad;Sullen  Affect Anxious;Depressed;Sad  Speech Logical/coherent;Soft  Interaction Isolative  Motor Activity Slow  Appearance/Hygiene Unremarkable  Behavior Characteristics Cooperative;Anxious  Mood Depressed;Anxious;Sad;Sullen  Thought Process  Coherency WDL  Content Blaming self  Delusions None reported or observed  Perception Hallucinations  Hallucination Auditory  Judgment Impaired  Confusion None  Danger to Self  Current suicidal ideation? Denies  Self-Injurious Behavior No self-injurious ideation or behavior indicators observed or expressed   Agreement Not to Harm Self Yes  Description of Agreement contracts for safety verbal   Danger to Others  Danger to Others None reported or observed
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
## 2021-02-20 NOTE — [facility] Group Notes (Signed)
1400 PsychoEducational Group- Positive Reframing education was discussed during this group. The [place] poem on ''watch your thoughts they become actions'' was read and patients were given examples of positive reframing. Pt participated minimally and appeared withdrawn.

## 2021-02-21 DIAGNOSIS — F251 Schizoaffective disorder, depressive type: Secondary | ICD-10-CM | POA: Diagnosis not present

## 2021-02-21 MED ORDER — LORAZEPAM 0.5 MG PO TABS
0.5000 mg | ORAL_TABLET | Freq: Two times a day (BID) | ORAL | Status: AC
  Administered 2021-02-21 – 2021-02-22 (×2): 0.5 mg via ORAL
  Filled 2021-02-21 (×2): qty 1

## 2021-02-21 NOTE — Group Note (Signed)
Recreation Therapy Group Note   Group Topic:Stress Management  Group Date: 02/21/2021 Start Time: 0935 End Time: 0955 Facilitators: Caroll Rancher, LRT,CTRS Location: [address] Hall Dayroom   Goal Area(s) Addresses:  Patient will actively participate in stress management techniques presented during session.  Patient will successfully identify benefit of practicing stress management post d/c.   Group Description: Guided Imagery. LRT provided education, instruction, and demonstration on practice of visualization via guided imagery. Patient was asked to participate in the technique introduced during session. LRT debriefed including topics of mindfulness, stress management and specific scenarios each patient could use these techniques. Patients were given suggestions of ways to access scripts post d/c and encouraged to explore Youtube and other apps available on smartphones, tablets, and computers.   Affect/Mood: Appropriate   Participation Level: Active   Participation Quality: Independent   Behavior: Appropriate   Speech/Thought Process: Focused   Insight: Good   Judgement: Good   Modes of Intervention: Script   Patient Response to Interventions:  Engaged   Education Outcome:  Acknowledges education and In group clarification offered    Clinical Observations/Individualized Feedback: Pt attended and participated in activity.    Plan: Continue to engage patient in RT group sessions 2-3x/week.   Caroll Rancher, LRT,CTRS 02/21/2021 11:54 AM

## 2021-02-21 NOTE — Progress Notes (Signed)
°   02/21/21 0618  Sleep  Number of Hours 7.5
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
## 2021-02-21 NOTE — [facility] Group Notes (Signed)
Adult Psychoeducational Group Note  Date:  02/21/2021 Time:  2:42 PM  Group Topic/Focus:  Wellness Toolbox:   The focus of this group is to discuss various aspects of wellness, balancing those aspects and exploring ways to increase the ability to experience wellness.  Patients will create a wellness toolbox for use upon discharge.  Participation Level:  Active  Participation Quality:  Appropriate  Affect:  Appropriate  Cognitive:  Alert  Insight: Appropriate  Engagement in Group:  Engaged  Modes of Intervention:  Activity  Additional Comments:  Patient attended and participated in the relaxation group activity.  Jearl Klinefelter 02/21/2021, 2:42 PM

## 2021-02-21 NOTE — Progress Notes (Signed)
D- Patient alert and oriented x4. Pt in a sad and depressed mood. Pt pacing in room and stating he was having auditory hallucinations telling him to "get out." Pt denies SI/HI. Denies visual hallucinations. Pt had minimal to no interaction with peers and staff and spent most of his time by the phone, talking to family members.   A- Scheduled medications administered to patient, per MD orders. PRN medications given for anxiety and AH. Routine safety checks conducted every 15 minutes.  Patient informed to notify staff with problems or concerns.   R- Patient compliant with medications and verbalized understanding treatment plan.   02/20/21 2200  Psych Admission Type (Psych Patients Only)  Admission Status Voluntary  Psychosocial Assessment  Patient Complaints Anxiety;Sadness;Depression  Eye Contact Brief  Facial Expression Anxious;Sad;Sullen  Affect Anxious;Depressed;Sad  Speech Logical/coherent;Soft  Interaction Isolative  Motor Activity Slow  Appearance/Hygiene Unremarkable  Behavior Characteristics Anxious;Pacing  Mood Depressed;Anxious;Sad;Sullen  Thought Process  Coherency Circumstantial  Content Blaming self  Delusions None reported or observed  Perception Hallucinations  Hallucination Auditory  Judgment Impaired  Confusion None  Danger to Self  Current suicidal ideation? Denies  Self-Injurious Behavior No self-injurious ideation or behavior indicators observed or expressed   Agreement Not to Harm Self Yes  Description of Agreement contracts for safety verbal  Danger to Others  Danger to Others None reported or observed

## 2021-02-21 NOTE — Group Note (Signed)
Type of Therapy and Topic:  Group Therapy:  Stress Management   Participation Level:  Active    Description of Group:  Patients in this group were introduced to the idea of stress and encouraged to discuss negative and positive ways to manage stress. Patients discussed specific stressors that they have in their life right now and the physical signs and symptoms associated with that stress.  Patient encouraged to come up with positive changes to assist with the stress upon discharge in order to prevent future hospitalizations.   They also worked as a group on developing a specific plan for several patients to deal with stressors through boundary-setting, psychoeducation and self care techniques   Therapeutic Goals:               1)  To discuss the positive and negative impacts of stress             2)  identify signs and symptoms of stress             3)  generate ideas for stress management             4)  offer mutual support to others regarding stress management             5)  Developing plans for ways to manage specific stressors upon discharge               Summary of Patient Progress:  Patient participated appropriately in group.  Patient discussed how he has overcome admitting he needs help.  Patient participated in circle of control activity to assist with helping with stress and worries.  Patient was open to receive and give feedback to group peers.    Therapeutic Modalities:   Motivational Interviewing Brief Solution-Focused Therapy   Thien Berka, LCSW, LCAS Clincal Social Worker  [facility]

## 2021-02-21 NOTE — Group Note (Signed)
Date:  02/21/2021 Time:  9:52 AM  Group Topic/Focus:  Orientation:   The focus of this group is to educate the patient on the purpose and policies of crisis stabilization and provide a format to answer questions about their admission.  The group details unit policies and expectations of patients while admitted.    Participation Level:  Active  Participation Quality:  Appropriate  Affect:  Appropriate  Cognitive:  Lacking  Insight: Lacking  Engagement in Group:  Lacking  Modes of Intervention:  Discussion  Additional Comments:  Lacking in involvement in group.  Jerrye Beavers 02/21/2021, 9:52 AM

## 2021-02-21 NOTE — Progress Notes (Signed)
Pt denies SI/HI/AVH and verbally agrees to approach staff if these become apparent or before harming themselves/others. Rates depression 5/10. Rates anxiety 3/10. Rates pain 0/10.  Pt stated that his depression has gone down a little. Pt has been going to groups and being active. Pt has been interacting with others a little more but is still somewhat isolative. Scheduled medications administered to pt, per MD orders. RN provided support and encouragement to pt. Q15 min safety checks implemented and continued. Pt safe on the unit. RN will continue to monitor and intervene as needed.   02/21/21 0753  Psych Admission Type (Psych Patients Only)  Admission Status Voluntary  Psychosocial Assessment  Patient Complaints Anxiety;Depression  Eye Contact Brief  Facial Expression Sad;Sullen  Affect Anxious;Depressed;Sad  Speech Logical/coherent;Soft  Interaction Isolative  Motor Activity Slow  Appearance/Hygiene Unremarkable  Behavior Characteristics Cooperative;Anxious  Mood Depressed;Sad;Pleasant;Anxious  Thought Process  Coherency WDL  Content Blaming self  Delusions None reported or observed  Perception WDL  Hallucination None reported or observed  Judgment Impaired  Confusion None  Danger to Self  Current suicidal ideation? Denies  Self-Injurious Behavior No self-injurious ideation or behavior indicators observed or expressed   Agreement Not to Harm Self Yes  Description of Agreement contracts for safety verbal  Danger to Others  Danger to Others None reported or observed

## 2021-02-22 DIAGNOSIS — F333 Major depressive disorder, recurrent, severe with psychotic symptoms: Secondary | ICD-10-CM | POA: Diagnosis not present

## 2021-02-22 DIAGNOSIS — F251 Schizoaffective disorder, depressive type: Secondary | ICD-10-CM | POA: Diagnosis not present

## 2021-02-22 NOTE — Progress Notes (Signed)
°   02/22/21 2342  Psych Admission Type (Psych Patients Only)  Admission Status Voluntary  Psychosocial Assessment  Patient Complaints Anxiety  Eye Contact Brief  Facial Expression Flat  Affect Appropriate to circumstance  Speech Logical/coherent  Interaction Forwards little;Guarded  Motor Activity Other (Comment) (WDL)  Appearance/Hygiene Unremarkable  Behavior Characteristics Appropriate to situation  Mood Depressed  Thought Process  Coherency WDL  Content WDL  Delusions None reported or observed  Perception WDL  Hallucination None reported or observed  Judgment Impaired  Confusion None  Danger to Self  Current suicidal ideation? Denies  Self-Injurious Behavior No self-injurious ideation or behavior indicators observed or expressed   Agreement Not to Harm Self Yes  Description of Agreement contracts for safety verbal  Danger to Others  Danger to Others None reported or observed

## 2021-02-22 NOTE — Progress Notes (Signed)
[facility] MD Progress Note  02/22/2021 3:28 PM Carlos Meyers  MRN: [id number] CC: "I am doing much better than when I came in"  Reason for admission: Carlos Meyers is a 36 y.o. male with PPHx of MDD with psychotic features and PTSD who presented to [facility] via sheriff's care for SI with plan and command auditory hallucinations, then admitted to [facility] for management and treatment of severe, recurrent major depressive episode with suicidal ideation and command auditory hallucinations.  [place] Day 5  Today's Note:  MAR reviewed, records reviewed and care was discussed with members of our interdisciplinary team.  Patient was seen in the office for daily evaluation and he reported some improvement in his mood.  He denied feeling suicidal and denied AH. He believes that his medications are effective.  He attends group therapy but informed writer that he prefers individual therapy.  Patient admitted that he is a poor communicator and does better doing 1:1 therapy.  He was in therapy in the outpatient setting and has 6 sessions to complete.  He want to engage in extra therapy when he completes his 6 session.  Patient reported good sleep and appetite.  Over all he believes he is doing much better compared to when he came in.  No behavior issues reported so far and he is visible in the dayroom.  He denied SI/HI/AVH and no mention of paranoia.  Diagnosis:  Principal Problem:   Major depressive disorder, recurrent episode, severe, with psychosis ([place]) Active Problems:   Psychosis ([place])   Alcohol dependence with withdrawal ([place])   Tobacco abuse   Past Psychiatric History: See H&P  Past Medical History:  Past Medical History:  Diagnosis Date   Hypertension    History reviewed. No pertinent surgical history. Family History:  Family History  Problem Relation Age of Onset   Healthy Mother    Healthy Father    Family Psychiatric  History: See H&P Social History:  Social History    Substance and Sexual Activity  Alcohol Use Not Currently   Alcohol/week: 8.0 standard drinks   Types: 8 Standard drinks or equivalent per week     Social History   Substance and Sexual Activity  Drug Use No    Social History   Socioeconomic History   Marital status: Significant Other    Spouse name: Not on file   Number of children: Not on file   Years of education: Not on file   Highest education level: Not on file  Occupational History   Not on file  Tobacco Use   Smoking status: Never   Smokeless tobacco: Never  Vaping Use   Vaping Use: Never used  Substance and Sexual Activity   Alcohol use: Not Currently    Alcohol/week: 8.0 standard drinks    Types: 8 Standard drinks or equivalent per week   Drug use: No   Sexual activity: Not Currently    Birth control/protection: None  Other Topics Concern   Not on file  Social History Narrative   Not on file   Social Determinants of Health   Financial Resource Strain: Not on file  Food Insecurity: Not on file  Transportation Needs: Not on file  Physical Activity: Not on file  Stress: Not on file  Social Connections: Not on file   Additional Social History:                         Sleep: Per above  Appetite:  Per above  Current Medications: Current Facility-Administered Medications  Medication Dose Route Frequency Provider Last Rate Last Admin   acetaminophen (TYLENOL) tablet 650 mg  650 mg Oral Q6H PRN Suella Broad, FNP   650 mg at 02/22/21 0933   alum & mag hydroxide-simeth (MAALOX/MYLANTA) 200-200-20 MG/5ML suspension 30 mL  30 mL Oral Q4H PRN Suella Broad, FNP   30 mL at 02/17/21 2220   FLUoxetine (PROZAC) capsule 20 mg  20 mg Oral Daily Merrily Brittle, DO   20 mg at 02/22/21 0931   gabapentin (NEURONTIN) capsule 300 mg  300 mg Oral BID Merrily Brittle, DO   300 mg at 02/22/21 [id number]   hydrocortisone cream 1 %   Topical BID Merrily Brittle, DO   Given at 02/22/21 [id number]   hydrOXYzine  (ATARAX) tablet 25 mg  25 mg Oral TID PRN Suella Broad, FNP   25 mg at 02/21/21 2129   ziprasidone (GEODON) injection 20 mg  20 mg Intramuscular Q12H PRN Suella Broad, FNP       And   LORazepam (ATIVAN) tablet 1 mg  1 mg Oral PRN Suella Broad, FNP       LORazepam (ATIVAN) tablet 1 mg  1 mg Oral Q6H PRN Merrily Brittle, DO       magnesium hydroxide (MILK OF MAGNESIA) suspension 30 mL  30 mL Oral Daily PRN Suella Broad, FNP       melatonin tablet 3 mg  3 mg Oral QHS Merrily Brittle, DO   3 mg at 02/21/21 2129   mirtazapine (REMERON) tablet 7.5 mg  7.5 mg Oral QHS PRN Maida Sale, MD   7.5 mg at 02/21/21 2129   nicotine (NICODERM CQ - dosed in mg/24 hours) patch 14 mg  14 mg Transdermal Daily Merrily Brittle, DO   14 mg at 02/22/21 0932   QUEtiapine (SEROQUEL) tablet 100 mg  100 mg Oral QHS Merrily Brittle, DO   100 mg at 02/21/21 2128   QUEtiapine (SEROQUEL) tablet 25 mg  25 mg Oral TID PRN Merrily Brittle, DO   25 mg at 02/20/21 1726   QUEtiapine (SEROQUEL) tablet 25 mg  25 mg Oral Q0600 Merrily Brittle, DO   25 mg at 02/22/21 0630   thiamine tablet 100 mg  100 mg Oral Daily Suella Broad, FNP   100 mg at 02/22/21 [id number]   Or   thiamine (B-1) injection 100 mg  100 mg Intravenous Daily Suella Broad, FNP        Lab Results: No results found for this or any previous visit (from the past 48 hour(s)).  Blood Alcohol level:  Lab Results  Component Value Date   ETH <10 02/15/2021   ETH 357 (HH) [id number]    Metabolic Disorder Labs: Lab Results  Component Value Date   HGBA1C 5.5 02/18/2021   MPG 111 02/18/2021   No results found for: PROLACTIN Lab Results  Component Value Date   CHOL 183 02/18/2021   TRIG 240 (H) 02/18/2021   HDL 60 02/18/2021   CHOLHDL 3.1 02/18/2021   VLDL 48 (H) 02/18/2021   LDLCALC 75 02/18/2021    Physical Findings: AIMS:  Facial and Oral Movements Muscles of Facial Expression: None, normal Lips and  Perioral Area: None, normal Jaw: None, normal Tongue: None, normal, Extremity Movements Upper (arms, wrists, hands, fingers): None, normal Lower (legs, knees, ankles, toes): None, normal,  Trunk Movements Neck, shoulders, hips: None, normal,  Overall Severity Severity  of abnormal movements (highest score from questions above): None, normal Incapacitation due to abnormal movements: None, normal Patient's awareness of abnormal movements (rate only patient's report): No Awareness,  Dental Status Current problems with teeth and/or dentures?: No Does patient usually wear dentures?: No  CIWA:  CIWA-Ar Total: 0 COWS:     Musculoskeletal: Strength & Muscle Tone: within normal limits Gait & Station: normal Patient leans: N/A  Psychiatric Specialty Exam:  Presentation  General Appearance: Casual; Neat  Eye Contact:Good  Speech:Clear and Coherent  Speech Volume:Normal  Handedness:Right   Mood and Affect  Mood:Depressed (Reported great improvement)  Affect:Congruent   Thought Process  Thought Processes:Coherent; Goal Directed; Linear  Descriptions of Associations:Intact   Orientation:Full (Time, Place and Person)   Thought Content:Logical   History of Schizophrenia/Schizoaffective disorder:No data recorded Duration of Psychotic Symptoms:N/A  Hallucinations:Hallucinations: None  Ideas of Reference:None   Suicidal Thoughts:Suicidal Thoughts: No  Homicidal Thoughts:Homicidal Thoughts: No   Sensorium  Memory:Immediate Good  Judgment:Fair  Insight:Fair   Executive Functions  Concentration:Fair  Attention Span:Fair  [place] of Knowledge:Good  Language:Good   Psychomotor Activity  Psychomotor Activity:Psychomotor Activity: Normal   Assets  Assets:Communication Skills; Desire for Improvement; Financial Resources/Insurance; Housing; Intimacy; Leisure Time   Sleep  Sleep:7.5 hours   Physical Exam: Vitals:   02/21/21 0621  02/21/21 1653 02/22/21 0627 02/22/21 0628  BP: 135/86 124/69 125/79 113/71  Pulse: 81 81 80 81  Temp:  98.9 F (37.2 C) 97.6 F (36.4 C)   Resp:   18   Height:      Weight:      SpO2: 99% 100% 99% 100%  TempSrc:      BMI (Calculated):         Physical Exam Constitutional:      Appearance: Normal appearance.  Eyes:     Conjunctiva/sclera: Conjunctivae normal.  Pulmonary:     Effort: Pulmonary effort is normal.  Skin:    General: Skin is warm and dry.  Neurological:     General: No focal deficit present.     Mental Status: He is alert and oriented to person, place, and time. Mental status is at baseline.    Review of Systems  Constitutional:  Negative for chills.  Respiratory:  Negative for shortness of breath.   Cardiovascular:  Negative for chest pain and palpitations.  Gastrointestinal:  Negative for abdominal pain, nausea and vomiting.  Neurological:  Negative for dizziness and headaches.    Treatment Plan & Assessment Summary: Principal Problem:   Major depressive disorder, recurrent episode, severe, with psychosis ([place]) Active Problems:   Psychosis ([place])   Alcohol dependence with withdrawal ([place])   Tobacco abuse  Carlos Meyers is a 36 y.o. male with psychotic features, PTSD, and Alcohol use disorder, who is admitted for management and treatment of a suicide attempt in the setting of mood-congruent command auditory hallucinations, SI, and a recurrent, severe major depressive episode. [place] Day 5   PLAN: Continue Prozac 20 mg po daily-Depression Continue Gabapentin 300 mg twice a day for mood Continue Melatonin 3 mg po at bed time for sleep Continue Mirtazapine 7.5 mg po as needed for sleep Continue Quetiapine 100 mg po at bed time time for mood/Mental health Offer Quetiapine 25 mg po tid as needed for Psychosis-Hallucination/Paranoia Continue Quetiapine 25 mg po daily for Mental health. Continue CIWA assessment every 6 hours and medicate per  protocol Continue Q 15 minutes monitoring Offer all PRN medications per protocol Signed: Charmaine Downs NP,  PMHNP-BC [facility] 02/22/2021, 3:28 PM

## 2021-02-22 NOTE — Group Note (Signed)
LCSW Group Therapy Note  Group Date: 02/22/2021 Start Time: 1030 End Time: 1130   Type of Therapy and Topic:  Group Therapy - Healthy vs Unhealthy Coping Skills  Participation Level:  Did Not Attend   Description of Group The focus of this group was to determine what unhealthy coping techniques typically are used by group members and what healthy coping techniques would be helpful in coping with various problems. Patients were guided in becoming aware of the differences between healthy and unhealthy coping techniques. Patients were asked to identify 2-3 healthy coping skills they would like to learn to use more effectively.  Therapeutic Goals Patients learned that coping is what human beings do all day long to deal with various situations in their lives Patients defined and discussed healthy vs unhealthy coping techniques Patients identified their preferred coping techniques and identified whether these were healthy or unhealthy Patients determined 2-3 healthy coping skills they would like to become more familiar with and use more often. Patients provided support and ideas to each other   Summary of Patient Progress:   The patient was invited to group, did not attend.   Therapeutic Modalities Cognitive Behavioral Therapy Motivational Interviewing  Marquette Old 02/22/2021  1:46 PM

## 2021-02-22 NOTE — [facility] Group Notes (Signed)
Goals Group 02/22/21    Group Focus: affirmation, clarity of thought, and goals/reality orientation Treatment Modality:  Psychoeducation Interventions utilized were assignment, group exercise, and support Purpose: To be able to understand and verbalize the reason for their admission to the hospital. To understand that the medication helps with their chemical imbalance but they also need to work on their choices in life. To be challenged to develop a list of 30 positives about themselves. Also introduce the concept that "feelings" are not reality.  Participation Level:  Active  Participation Quality:  Appropriate  Affect:  Appropriate  Cognitive:  Appropriate  Insight:  Improving  Engagement in Group:  Engaged  Additional Comments:  Rates his energy at a 5/10. States his goal is to get back to "being myself". When asked to write 30 positives about himself, his response was, "I cannot do that".  Dione Housekeeper

## 2021-02-22 NOTE — Plan of Care (Signed)
?  Problem: Education: ?Goal: Knowledge of the prescribed therapeutic regimen will improve ?Outcome: Progressing ?  ?Problem: Coping: ?Goal: Coping ability will improve ?Outcome: Progressing ?Goal: Will verbalize feelings ?Outcome: Progressing ?  ?

## 2021-02-22 NOTE — Progress Notes (Signed)
[facility] Group Notes:  (Nursing/MHT/Case Management/Adjunct)  Date:  02/22/2021  Time:  2015  Type of Therapy:   wrap up group  Participation Level:  Active  Participation Quality:  Appropriate, Attentive, Sharing, and Supportive  Affect:  Flat  Cognitive:  Alert  Insight:  Improving  Engagement in Group:  Engaged  Modes of Intervention:  Clarification, Education, and Support  Summary of Progress/Problems: Positive thinking and positive change were discussed. Pt shares he is grateful for support from this hospital and his family. Pt plans on not drinking any more once discharged from hospital.   [name] 02/22/2021, 9:10 PM

## 2021-02-22 NOTE — [facility] Group Notes (Signed)
.  Psychoeducational Group Note    Date:02/22/21 Time: 1300-1400    Purpose of Group: . The group focus' on teaching patients on how to identify their needs and their Life Skills:  A group where two lists are made. What people need and what are things that we do that are unhealthy. The lists are developed by the patients and it is explained that we often do the actions that are not healthy to get our list of needs met.  Goal:: to develop the coping skills needed to get their needs met  Participation Level:  inactive  Participation Quality:  inappropriate  Affect: eyes closed  Cognitive:  Oriented  Insight:  unknown  Engagement in Group:  unknown  Additional Comments:  Pt rates his energy at a 5/10.  Pt sat in the corner of the room and only spoke when spoken to. Did not offer anything else to the group.  Paulino Rily

## 2021-02-22 NOTE — Progress Notes (Addendum)
D. Pt presents with a sad affect/ depressed mood- Pt has been visible on the unit- observed attending groups today. Per pt's self inventory, pt rated his depression, hopelessness and anxiety a 3/0/3, respectively. Pt reported that his goal was to work on "getting back to my normal self". Pt currently denies SI/HI and AVH   A. Labs and vitals monitored. Pt given scheduled meds and prn Tylenol for HA 7/10 this am.. Pt supported emotionally and encouraged to express concerns and ask questions.   R. Pt remains safe with 15 minute checks. Will continue POC.

## 2021-02-22 NOTE — Progress Notes (Signed)
° ° °   02/21/21 2128  Psych Admission Type (Psych Patients Only)  Admission Status Voluntary  Psychosocial Assessment  Patient Complaints Anxiety;Depression;Sadness  Eye Contact Brief  Facial Expression Sad;Sullen  Affect Anxious;Depressed;Sad  Speech Logical/coherent;Soft  Interaction Forwards little;Isolative  Motor Activity Slow  Appearance/Hygiene Unremarkable  Behavior Characteristics Cooperative;Anxious  Mood Depressed;Anxious;Sad;Pleasant  Thought Process  Coherency WDL  Content Blaming self  Delusions None reported or observed  Perception WDL  Hallucination None reported or observed  Judgment Impaired  Confusion None  Danger to Self  Current suicidal ideation? Denies  Self-Injurious Behavior No self-injurious ideation or behavior indicators observed or expressed   Agreement Not to Harm Self Yes  Description of Agreement contracts for safety verbal  Danger to Others  Danger to Others None reported or observed

## 2021-02-23 MED ORDER — QUETIAPINE FUMARATE 50 MG PO TABS
150.0000 mg | ORAL_TABLET | Freq: Every day | ORAL | Status: DC
  Administered 2021-02-23 – 2021-02-24 (×2): 150 mg via ORAL
  Filled 2021-02-23 (×5): qty 3

## 2021-02-23 MED ORDER — GABAPENTIN 300 MG PO CAPS
300.0000 mg | ORAL_CAPSULE | Freq: Three times a day (TID) | ORAL | Status: DC
  Administered 2021-02-23 – 2021-02-28 (×15): 300 mg via ORAL
  Filled 2021-02-23 (×20): qty 1

## 2021-02-23 MED ORDER — NALTREXONE HCL 50 MG PO TABS
50.0000 mg | ORAL_TABLET | Freq: Every day | ORAL | Status: DC
  Administered 2021-02-23 – 2021-02-26 (×4): 50 mg via ORAL
  Filled 2021-02-23 (×7): qty 1

## 2021-02-23 NOTE — [facility] Group Notes (Signed)
Psychoeducational Group Note ° °Date:  02/02/2021 °Time:  1300-1400 ° ° °Group Topic/Focus: This is Meyers continuation of the group from Saturday. Pt's have been asked to formulate Meyers list of 30 positives about themselves. This list is to be read 2 times Meyers day for 30 days, looking in Meyers mirror. Changing patterns of negative self talk. Also discussed is the fact that there have been some people who hurt us in the past. We keep that memory alive within us. Ways to cope with this are discused ° ° °Participation Level: did not attend °Carlos Meyers ° °
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
## 2021-02-23 NOTE — Plan of Care (Signed)
Nurse discussed depression and coping skills with patient.  

## 2021-02-23 NOTE — [facility] Group Notes (Signed)
Adult Psychoeducational Group Not °Date:  02/23/2021 °Time:  0900-1045 °Group Topic/Focus: PROGRESSIVE RELAXATION. A group where deep breathing is taught and tensing and relaxation muscle groups is used. Imagery is used as well.  Pts are asked to imagine 3 pillars that hold them up when they are not able to hold themselves up. ° °Participation Level:  did not attend ° °Carlos Meyers A ° °

## 2021-02-23 NOTE — Progress Notes (Signed)
Psychoeducational Group Note  Date:  02/23/2021 Time:  2042  Group Topic/Focus:  Wrap-Up Group:   The focus of this group is to help patients review their daily goal of treatment and discuss progress on daily workbooks.  Participation Level: Did Not Attend  Participation Quality:  Not Applicable  Affect:  Not Applicable  Cognitive:  Not Applicable  Insight:  Not Applicable  Engagement in Group: Not Applicable  Additional Comments:  The patient did not attend group this evening.   Hazle Coca S 02/23/2021, 8:42 PM

## 2021-02-23 NOTE — Group Note (Signed)
[facility] LCSW Group Therapy Note  02/23/2021  10:00-11:00AM  Type of Therapy and Topic:  Group Therapy:  Building Supports  Participation Level:  Did Not Attend   Description of Group:  Patients in this group were introduced to the idea of adding a variety of healthy supports to address the various needs in their lives.  Different types of support were defined and described, and patients were asked to act out what each type could be.  Patients discussed what additional healthy supports could be helpful in their recovery and wellness after discharge in order to prevent future hospitalizations.   An emphasis was placed on following up with the discharge plan when they leave the hospital in order to continue becoming healthier and happier.  Two songs were played during group to help further patients' understanding.  Therapeutic Goals: 1)  demonstrate the importance of adding supports  2)  discuss 4 definitions of support  3)  identify the patient's current level of healthy support and   4)  elicit commitments to add one healthy support   Summary of Patient Progress:  The patient was invited to group, did not attend.  Therapeutic Modalities:   Motivational Interviewing Brief Solution-Focused Therapy  Maretta Los

## 2021-02-23 NOTE — Progress Notes (Signed)
D:  Patient denied SI and HI, contracts for safety.  Denied visual hallucinations.  Stated he does hear voices, mumblings telling him to leave hospital.  Stated he wanted his seroquel increased 25 mg in the morning to help with voices.   A:  Medications administered per MD orders.  Emotional support and encouragement given patient. R:  Safety maintained with 15 minute checks.  Patient stated he does like [facility], has been there in the past.

## 2021-02-23 NOTE — Progress Notes (Signed)
Patient's self inventory sheet, patient sleeps good, sleep medication helpful.  Good appetite, low energy level, good concentration.  Rated depression 4, hopeless 5, anxiety 3.  Withdrawals, chilling.  Denied SI.  Denied physical problems.  Denied physical pain.  Goal today to feel better.   Plans to rest.  No discharge plans.  Patient continues to lay in bed today.

## 2021-02-24 ENCOUNTER — Encounter (HOSPITAL_COMMUNITY): Payer: Self-pay

## 2021-02-24 MED ORDER — WHITE PETROLATUM EX OINT
TOPICAL_OINTMENT | CUTANEOUS | Status: AC
  Filled 2021-02-24: qty 25

## 2021-02-24 NOTE — Progress Notes (Signed)
°   02/23/21 2323  Psych Admission Type (Psych Patients Only)  Admission Status Voluntary  Psychosocial Assessment  Patient Complaints Anxiety;Crying spells;Depression  Eye Contact Brief  Facial Expression Flat  Affect Appropriate to circumstance  Speech Logical/coherent  Interaction Forwards little;Guarded  Motor Activity Other (Comment) (WDL)  Appearance/Hygiene Unremarkable  Behavior Characteristics Appropriate to situation  Mood Depressed;Anxious  Thought Process  Coherency WDL  Content WDL  Delusions None reported or observed  Perception WDL  Hallucination None reported or observed  Judgment Impaired  Confusion None  Danger to Self  Current suicidal ideation? Denies  Self-Injurious Behavior No self-injurious ideation or behavior indicators observed or expressed   Agreement Not to Harm Self Yes  Description of Agreement contracts for safety verbal  Danger to Others  Danger to Others None reported or observed
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
## 2021-02-24 NOTE — [facility] Counselor (Signed)
Provided patient with resources for substance use residential programs, SAIOP and education/information on how to apply for disability.    Jahzir Strohmeier, LCSW, LCAS Clincal Social Worker  [facility]

## 2021-02-24 NOTE — Progress Notes (Signed)
D:  Patient slept 3-4 hours last night.  Has been hearing voices, mumblings.  Was told this weekend that his brother was killed in [place].  These thoughts have been on his mind.  Feels very depressed. A:  Medications administered per MD orders.  Emotional support and encouragement given patient. R:  Safety maintained with 15 minute checks.

## 2021-02-24 NOTE — Group Note (Deleted)
Date:  02/24/2021 Time:  10:02 AM  Group Topic/Focus:  Goals Group:   The focus of this group is to help patients establish daily goals to achieve during treatment and discuss how the patient can incorporate goal setting into their daily lives to aide in recovery. Orientation:   The focus of this group is to educate the patient on the purpose and policies of crisis stabilization and provide a format to answer questions about their admission.  The group details unit policies and expectations of patients while admitted.    Participation Level:  Active  Participation Quality:  Appropriate  Affect:  Appropriate  Cognitive:  Appropriate  Insight: Appropriate  Engagement in Group:  Engaged  Modes of Intervention:  Discussion  Additional Comments:  Pt wants to be discharged  Garvin Fila 02/24/2021, 10:02 AM

## 2021-02-24 NOTE — Group Note (Deleted)
Date:  02/24/2021 °Time:  9:38 AM ° °Group Topic/Focus:  °Goals Group:   The focus of this group is to help patients establish daily goals to achieve during treatment and discuss how the patient can incorporate goal setting into their daily lives to aide in recovery. °Orientation:   The focus of this group is to educate the patient on the purpose and policies of crisis stabilization and provide a format to answer questions about their admission.  The group details unit policies and expectations of patients while admitted. ° ° ° ° °Participation Level:  {[facility] PARTICIPATION LEVEL:22264} ° °Participation Quality:  {[facility] PARTICIPATION QUALITY:22265} ° °Affect:  {[facility] AFFECT:22266} ° °Cognitive:  {[facility] COGNITIVE:22267} ° °Insight: {[facility] Insight2:20797} ° °Engagement in Group:  {[facility] ENGAGEMENT IN GROUP:22268} ° °Modes of Intervention:  {[facility] MODES OF INTERVENTION:22269} ° °Additional Comments:  *** ° °Carlos Meyers °02/24/2021, 9:38 AM ° °

## 2021-02-24 NOTE — Group Note (Signed)
Date:  02/24/2021 Time:  10:15 AM  Group Topic/Focus:  Goals Group:   The focus of this group is to help patients establish daily goals to achieve during treatment and discuss how the patient can incorporate goal setting into their daily lives to aide in recovery. Orientation:   The focus of this group is to educate the patient on the purpose and policies of crisis stabilization and provide a format to answer questions about their admission.  The group details unit policies and expectations of patients while admitted.    Participation Level:  Minimal  Participation Quality:  Appropriate  Affect:  Appropriate  Cognitive:  Appropriate  Insight: Appropriate  Engagement in Group:  Engaged  Modes of Intervention:  Discussion  Additional Comments:  Pt wants to talk to Dr. About discharging  Jaquita Rector 02/24/2021, 10:15 AM

## 2021-02-24 NOTE — Group Note (Signed)
°  Type of Therapy and Topic:  Group Therapy:  Healthy and Unhealthy Supports  Participation Level:  Active   Description of Group:  Patients in this group were introduced to the idea of adding a variety of healthy supports to address the various needs in their lives.Patients discussed what additional healthy supports could be helpful in their recovery and wellness after discharge in order to prevent future hospitalizations.   An emphasis was placed on using counselor, doctor, therapy groups, 12-step groups, and problem-specific support groups to expand supports.  They also worked as a group on developing a specific plan for several patients to deal with unhealthy supports through boundary-setting, psychoeducation with loved ones, and even termination of relationships.   Therapeutic Goals:   1)  discuss importance of adding supports to stay well once out of the hospital  2)  compare healthy versus unhealthy supports and identify some examples of each  3)  generate ideas and descriptions of healthy supports that can be added  4)  offer mutual support about how to address unhealthy supports  5)  encourage active participation in and adherence to discharge plan    Summary of Patient Progress:  Patient participated appropriately in groups.  Patient participated in discussion and processed supports in life through an activity.  Patient offered and accepted feedback from other peers in the group.    Therapeutic Modalities:   Motivational Interviewing Brief Solution-Focused Therapy  Beatris Si, LCSW 02/24/2021  1:46 PM

## 2021-02-24 NOTE — BH IP Treatment Plan (Signed)
Interdisciplinary Treatment and Diagnostic Plan Update  02/24/2021 Time of Session: 10:05am Carlos Meyers MRN: [id number]  Principal Diagnosis: Major depressive disorder, recurrent episode, severe, with psychosis ([place])  Secondary Diagnoses: Principal Problem:   Major depressive disorder, recurrent episode, severe, with psychosis ([place]) Active Problems:   Psychosis ([place])   Alcohol dependence with withdrawal ([place])   Tobacco abuse   Current Medications:  Current Facility-Administered Medications  Medication Dose Route Frequency Provider Last Rate Last Admin   acetaminophen (TYLENOL) tablet 650 mg  650 mg Oral Q6H PRN Suella Broad, FNP   650 mg at 02/23/21 2056   alum & mag hydroxide-simeth (MAALOX/MYLANTA) 200-200-20 MG/5ML suspension 30 mL  30 mL Oral Q4H PRN Suella Broad, FNP   30 mL at 02/17/21 2220   FLUoxetine (PROZAC) capsule 20 mg  20 mg Oral Daily Merrily Brittle, DO   20 mg at 02/24/21 [id number]   gabapentin (NEURONTIN) capsule 300 mg  300 mg Oral TID Merrily Brittle, DO   300 mg at 02/24/21 0744   hydrocortisone cream 1 %   Topical BID Merrily Brittle, DO   Given at 02/24/21 0747   hydrOXYzine (ATARAX) tablet 25 mg  25 mg Oral TID PRN Suella Broad, FNP   25 mg at 02/23/21 2056   ziprasidone (GEODON) injection 20 mg  20 mg Intramuscular Q12H PRN Suella Broad, FNP       And   LORazepam (ATIVAN) tablet 1 mg  1 mg Oral PRN Starkes-Perry, Gayland Curry, FNP       magnesium hydroxide (MILK OF MAGNESIA) suspension 30 mL  30 mL Oral Daily PRN Suella Broad, FNP       melatonin tablet 3 mg  3 mg Oral QHS Merrily Brittle, DO   3 mg at 02/23/21 2056   mirtazapine (REMERON) tablet 7.5 mg  7.5 mg Oral QHS PRN Maida Sale, MD   7.5 mg at 02/22/21 2110   naltrexone (DEPADE) tablet 50 mg  50 mg Oral Daily Merrily Brittle, DO   50 mg at 02/24/21 0747   nicotine (NICODERM CQ - dosed in mg/24 hours) patch 14 mg  14 mg Transdermal Daily Merrily Brittle, DO    14 mg at 02/22/21 0932   QUEtiapine (SEROQUEL) tablet 150 mg  150 mg Oral QHS Merrily Brittle, DO   150 mg at 02/23/21 2056   QUEtiapine (SEROQUEL) tablet 25 mg  25 mg Oral TID PRN Merrily Brittle, DO   25 mg at 02/22/21 1959   QUEtiapine (SEROQUEL) tablet 25 mg  25 mg Oral Q0600 Merrily Brittle, DO   25 mg at 02/24/21 0743   thiamine tablet 100 mg  100 mg Oral Daily Suella Broad, FNP   100 mg at 02/24/21 [id number]   Or   thiamine (B-1) injection 100 mg  100 mg Intravenous Daily Starkes-Perry, Gayland Curry, FNP       white petrolatum (VASELINE) gel            PTA Medications: No medications prior to admission.    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   Physician Treatment Plan for Primary Diagnosis: Major depressive disorder, recurrent episode, severe, with psychosis ([place]) Long  Term Goal(s):     Short Term Goals:    Medication Management: Evaluate patient's response, side effects, and tolerance of medication regimen.  Therapeutic Interventions: 1 to 1 sessions, Unit Group sessions and Medication administration.  Evaluation of Outcomes: Progressing  Physician Treatment Plan for Secondary Diagnosis: Principal Problem:   Major depressive disorder, recurrent episode, severe, with psychosis ([place]) Active Problems:   Psychosis ([place])   Alcohol dependence with withdrawal ([place])   Tobacco abuse  Long Term Goal(s):     Short Term Goals:       Medication Management: Evaluate patient's response, side effects, and tolerance of medication regimen.  Therapeutic Interventions: 1 to 1 sessions, Unit Group sessions and  Medication administration.  Evaluation of Outcomes: Progressing   RN Treatment Plan for Primary Diagnosis: Major depressive disorder, recurrent episode, severe, with psychosis ([place]) Long Term Goal(s): Knowledge of disease and therapeutic regimen to maintain health will improve  Short Term Goals: Ability to remain free from injury will improve, Ability to verbalize frustration and anger appropriately will improve, and Compliance with prescribed medications will improve  Medication Management: RN will administer medications as ordered by provider, will assess and evaluate patient's response and provide education to patient for prescribed medication. RN will report any adverse and/or side effects to prescribing provider.  Therapeutic Interventions: 1 on 1 counseling sessions, Psychoeducation, Medication administration, Evaluate responses to treatment, Monitor vital signs and CBGs as ordered, Perform/monitor CIWA, COWS, AIMS and Fall Risk screenings as ordered, Perform wound care treatments as ordered.  Evaluation of Outcomes: Progressing   LCSW Treatment Plan for Primary Diagnosis: Major depressive disorder, recurrent episode, severe, with psychosis ([place]) Long Term Goal(s): Safe transition to appropriate next level of care at discharge, Engage patient in therapeutic group addressing interpersonal concerns.  Short Term Goals: Engage patient in aftercare planning with referrals and resources, Increase social support, and Increase skills for wellness and recovery  Therapeutic Interventions: Assess for all discharge needs, 1 to 1 time with Social worker, Explore available resources and support systems, Assess for adequacy in community support network, Educate family and significant other(s) on suicide prevention, Complete Psychosocial Assessment, Interpersonal group therapy.  Evaluation of Outcomes: Progressing   Progress in Treatment: Attending groups: Yes. Participating in groups:  Yes. Taking medication as prescribed: Yes. Toleration medication: Yes. Family/Significant other contact made: Yes, individual(s) contacted:  Girlfriend and Mother  Patient understands diagnosis: Yes. Discussing patient identified problems/goals with staff: Yes. Medical problems stabilized or resolved: Yes. Denies suicidal/homicidal ideation: Yes. Issues/concerns per patient self-inventory: No.     New problem(s) identified: No, Describe:  None    New Short Term/Long Term Goal(s): medication stabilization, elimination of SI thoughts, development of comprehensive mental wellness plan.    Patient Goals: "To get better"   Discharge Plan or Barriers: Patient is to follow up at [facility] at discharge. Pt is to be released to [facility] at discharge.   Reason for Continuation of Hospitalization: Depression Medication stabilization    Estimated Length of Stay: 3 to 5 days    Scribe for Treatment Team: Vassie Moselle, LCSW 02/24/2021 10:47 AM

## 2021-02-24 NOTE — Progress Notes (Signed)
°   02/24/21 2205  Psych Admission Type (Psych Patients Only)  Admission Status Voluntary  Psychosocial Assessment  Patient Complaints Anxiety;Worrying;Sadness  Eye Contact Brief  Facial Expression Pained;Sullen;Sad  Affect Depressed;Sad  Speech Logical/coherent  Interaction Minimal  Motor Activity Other (Comment) (WDL)  Appearance/Hygiene Unremarkable  Behavior Characteristics Cooperative;Appropriate to situation  Mood Depressed;Sad;Sullen  Thought Process  Coherency WDL  Content WDL  Delusions None reported or observed  Perception WDL  Hallucination None reported or observed  Judgment Impaired  Confusion None  Danger to Self  Current suicidal ideation? Denies  Self-Injurious Behavior No self-injurious ideation or behavior indicators observed or expressed   Agreement Not to Harm Self Yes  Description of Agreement Verbal contract  Danger to Others  Danger to Others None reported or observed

## 2021-02-24 NOTE — Group Note (Signed)
Recreation Therapy Group Note   Group Topic:Stress Management  Group Date: 02/24/2021 Start Time: 0935 End Time: 0952 Facilitators: Caroll Rancher, [place] Location: [address] Dayroom   Goal Area(s) Addresses:  Patient will identify positive stress management techniques. Patient will identify benefits of using stress management post d/c.  Group Description:  Meditation.  LRT played a meditation that focused on taking good intentions and positive thoughts into your day.  Patients were to listen and follow along as the meditation played to fully engage in the activity.   Affect/Mood: N/A   Participation Level: Did not attend    Clinical Observations/Individualized Feedback:     Plan: Continue to engage patient in RT group sessions 2-3x/week.   Caroll Rancher, LRT,CTRS 02/24/2021 12:15 PM

## 2021-02-24 NOTE — [facility] Group Notes (Signed)
Psychoeducational Group- Pts were read ''There's a hole in my sidewalk'' pt Carlos Meyers. Pts were asked to identify negative behavioral patterns as it relates to their mental health. Pt attended, shared minimally.

## 2021-02-24 NOTE — Group Note (Deleted)
Date:  02/24/2021 °Time:  9:34 AM ° °Group Topic/Focus:  °Goals Group:   The focus of this group is to help patients establish daily goals to achieve during treatment and discuss how the patient can incorporate goal setting into their daily lives to aide in recovery. °Orientation:   The focus of this group is to educate the patient on the purpose and policies of crisis stabilization and provide a format to answer questions about their admission.  The group details unit policies and expectations of patients while admitted. ° ° ° ° °Participation Level:  {[facility] PARTICIPATION LEVEL:22264} ° °Participation Quality:  {[facility] PARTICIPATION QUALITY:22265} ° °Affect:  {[facility] AFFECT:22266} ° °Cognitive:  {[facility] COGNITIVE:22267} ° °Insight: {BHH Insight2:20797} ° °Engagement in Group:  {[facility] ENGAGEMENT IN GROUP:22268} ° °Modes of Intervention:  {[facility] MODES OF INTERVENTION:22269} ° °Additional Comments:  *** ° °Carlos Meyers °02/24/2021, 9:34 AM ° °

## 2021-02-24 NOTE — Group Note (Deleted)
Date:  02/24/2021 Time:  9:43 AM  Group Topic/Focus:  Goals Group:   The focus of this group is to help patients establish daily goals to achieve during treatment and discuss how the patient can incorporate goal setting into their daily lives to aide in recovery. Orientation:   The focus of this group is to educate the patient on the purpose and policies of crisis stabilization and provide a format to answer questions about their admission.  The group details unit policies and expectations of patients while admitted.    Participation Level:  Active  Participation Quality:  Appropriate  Affect:  Appropriate  Cognitive:  Appropriate  Insight: Appropriate  Engagement in Group:  Engaged  Modes of Intervention:  Discussion  Additional Comments:  Pt wants to Talk with SW and DR.  Jaquita Rector 02/24/2021, 9:43 AM

## 2021-02-24 NOTE — Plan of Care (Signed)
Nurse discussed anxiety, depression and coping skills with patient.  

## 2021-02-25 DIAGNOSIS — F4310 Post-traumatic stress disorder, unspecified: Secondary | ICD-10-CM | POA: Diagnosis present

## 2021-02-25 LAB — HEPATIC FUNCTION PANEL
ALT: 89 U/L — ABNORMAL HIGH (ref 0–44)
AST: 48 U/L — ABNORMAL HIGH (ref 15–41)
Albumin: 4.6 g/dL (ref 3.5–5.0)
Alkaline Phosphatase: 53 U/L (ref 38–126)
Bilirubin, Direct: 0.1 mg/dL (ref 0.0–0.2)
Indirect Bilirubin: 0.6 mg/dL (ref 0.3–0.9)
Total Bilirubin: 0.7 mg/dL (ref 0.3–1.2)
Total Protein: 7.9 g/dL (ref 6.5–8.1)

## 2021-02-25 MED ORDER — ONDANSETRON 4 MG PO TBDP: 4.0000 mg | ORAL_TABLET | Freq: Four times a day (QID) | ORAL | Status: DC | PRN

## 2021-02-25 MED ORDER — ZIPRASIDONE MESYLATE 20 MG IM SOLR: 20.0000 mg | Freq: Two times a day (BID) | INTRAMUSCULAR | Status: DC | PRN

## 2021-02-25 MED ORDER — LORAZEPAM 1 MG PO TABS: 1.0000 mg | ORAL_TABLET | Freq: Four times a day (QID) | ORAL | Status: DC | PRN

## 2021-02-25 MED ORDER — LOPERAMIDE HCL 2 MG PO CAPS: 2.0000 mg | ORAL_CAPSULE | ORAL | Status: DC | PRN

## 2021-02-25 MED ORDER — ADULT MULTIVITAMIN W/MINERALS CH
1.0000 | ORAL_TABLET | Freq: Every day | ORAL | Status: DC
  Administered 2021-02-25 – 2021-02-28 (×4): 1 via ORAL
  Filled 2021-02-25 (×7): qty 1

## 2021-02-25 MED ORDER — QUETIAPINE FUMARATE 200 MG PO TABS
200.0000 mg | ORAL_TABLET | Freq: Every day | ORAL | Status: DC
  Administered 2021-02-25 – 2021-02-27 (×3): 200 mg via ORAL
  Filled 2021-02-25 (×5): qty 1

## 2021-02-25 MED ORDER — LORAZEPAM 1 MG PO TABS: 1.0000 mg | ORAL_TABLET | ORAL | Status: DC | PRN

## 2021-02-25 MED ORDER — MELATONIN 3 MG PO TABS
3.0000 mg | ORAL_TABLET | Freq: Every evening | ORAL | Status: DC | PRN
  Administered 2021-02-25 – 2021-02-27 (×3): 3 mg via ORAL
  Filled 2021-02-25 (×2): qty 1

## 2021-02-25 NOTE — Progress Notes (Signed)
D:  Patient denied SI and HI, contracts for safety.  Denied A/V hallucinations.   A:  Medications administered per MD orders.  Emotional support and encouragement given patient. R:  Safety maintained with 15 minute checks.  

## 2021-02-25 NOTE — Progress Notes (Signed)
Patient's self inventory sheet, patient sleeps good, sleep medication helpful.  Good appetite, low energy level, good concentration.  Rated depression 5, hopeless 5, anxiety 4.  Withdrawals, chilling.  Denied SI.  Denied physical problems.  Goal is be myself again.  No plans for today.  Plans to discharge home.
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
## 2021-02-25 NOTE — Plan of Care (Signed)
Nurse discussed coping skills with patient.  

## 2021-02-25 NOTE — Progress Notes (Signed)
The patient\'s positive event for the day is that he spoke with his kids. He did not come up with a goal.

## 2021-02-26 MED ORDER — FLUOXETINE HCL 10 MG PO CAPS
30.0000 mg | ORAL_CAPSULE | Freq: Every day | ORAL | Status: DC
  Administered 2021-02-27 – 2021-02-28 (×2): 30 mg via ORAL
  Filled 2021-02-26 (×4): qty 3

## 2021-02-26 MED ORDER — NICOTINE 14 MG/24HR TD PT24: 14.0000 mg | MEDICATED_PATCH | Freq: Every day | TRANSDERMAL | Status: DC | PRN

## 2021-02-26 NOTE — [facility] Group Notes (Signed)
Pt attended group and contributed 

## 2021-02-26 NOTE — Plan of Care (Addendum)
D:  Patient self inventory sheet, patient sleeps good.  Sleep medication helpful.  Fair appetite, normal energy level, good concentration.  Rated depression 7, denied hopeless, anxiety 4.  Withdrawals.  Denied SI.  Physical problems.  Physical pain denied.  Goal is work on depression.  Plans to take meds.  No discharge plans. R:  Medications administered per MD orders.  Emotional support and encouragement given patient. R:  Denied SI and HI, contracts for safety.  Denied A/V hallucinations.  Safety maintained with 15 minute checks. Marland Kitchen

## 2021-02-26 NOTE — Progress Notes (Signed)
Psychoeducational Group Note  Date:  02/26/2021 Time:  2128  Group Topic/Focus:  Wrap-Up Group:   The focus of this group is to help patients review their daily goal of treatment and discuss progress on daily workbooks.  Participation Level: Did Not Attend  Participation Quality:  Not Applicable  Affect:  Not Applicable  Cognitive:  Not Applicable  Insight:  Not Applicable  Engagement in Group: Not Applicable  Additional Comments:  The patient did not attend group this evening.   Hazle Coca S 02/26/2021, 9:28 PM

## 2021-02-26 NOTE — Progress Notes (Signed)
D- Patient alert and oriented x4. Patient in depressed mood.  Denies SI, HI, VH. Pt did mention hearing the voice of his friend that recently died. Pt remains isolative and spent most of his free time on the phone.    A- Scheduled medications administered to patient, per MD orders.Routine safety checks conducted every 15 minutes.  Patient informed to notify staff with problems or concerns.   R- Patient compliant with medications and verbalized understanding treatment plan.   02/25/21 2205  Psych Admission Type (Psych Patients Only)  Admission Status Involuntary  Psychosocial Assessment  Patient Complaints Anxiety;Sadness  Eye Contact Brief  Facial Expression Sad  Affect Depressed;Sad  Speech Logical/coherent  Interaction Minimal  Motor Activity Other (Comment) (WDL)  Appearance/Hygiene Unremarkable  Behavior Characteristics Anxious;Cooperative  Mood Anxious;Depressed;Sad  Thought Process  Coherency WDL  Content WDL  Delusions None reported or observed  Perception WDL  Hallucination Auditory  Judgment Impaired  Confusion None  Danger to Self  Current suicidal ideation? Denies  Self-Injurious Behavior No self-injurious ideation or behavior indicators observed or expressed   Agreement Not to Harm Self Yes  Description of Agreement Verbal contract  Danger to Others  Danger to Others None reported or observed

## 2021-02-26 NOTE — Group Note (Addendum)
[facility] LCSW Group Therapy Note   Group Date: 02/26/2021 Start Time: 1300 End Time: 1400   Type of Therapy/Topic:  Group Therapy:  Emotion Regulation  Participation Level:  Active   Mood:okay  Description of Group:    The purpose of this group is to assist patients in learning to regulate negative emotions and experience positive emotions. Patients will be guided to discuss ways in which they have been vulnerable to their negative emotions. These vulnerabilities will be juxtaposed with experiences of positive emotions or situations, and patients challenged to use positive emotions to combat negative ones. Special emphasis will be placed on coping with negative emotions in conflict situations, and patients will process healthy conflict resolution skills.  Therapeutic Goals: Patient will identify two positive emotions or experiences to reflect on in order to balance out negative emotions:  Patient will label two or more emotions that they find the most difficult to experience:  Patient will be able to demonstrate positive conflict resolution skills through discussion or role plays:   Summary of Patient Progress:  Pt came to group and shared his name and that he is currently feeling good. Pt was appropriate during group discussion.     Therapeutic Modalities:   Cognitive Behavioral Therapy Feelings Identification Dialectical Behavioral Therapy   Felizardo Hoffmann, LCSWA

## 2021-02-26 NOTE — Plan of Care (Addendum)
Nurse discussed coping skills with patient.  

## 2021-02-26 NOTE — Group Note (Signed)
Recreation Therapy Group Note   Group Topic:Stress Management  Group Date: 02/26/2021 Start Time: 0935 End Time: 0950 Facilitators: Caroll Rancher, [place] Location: [address] Dayroom   Goal Area(s) Addresses:  Patient will identify positive stress management techniques. Patient will identify benefits of using stress management post d/c.  Group Description:  Meditation.  LRT played a meditation that focused on being resilient.  Patients were to focus and be attentive to the meditation as it played.  Patients were to also focus on their breathing and allow it to relax them as much as possible.   Affect/Mood: N/A   Participation Level: Did not attend    Clinical Observations/Individualized Feedback:     Plan: Continue to engage patient in RT group sessions 2-3x/week.   Caroll Rancher, LRT,CTRS 02/26/2021 11:42 AM
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
## 2021-02-27 LAB — HEPATITIS PANEL, ACUTE
HCV Ab: NONREACTIVE
Hep A IgM: NONREACTIVE
Hep B C IgM: NONREACTIVE
Hepatitis B Surface Ag: NONREACTIVE

## 2021-02-27 NOTE — Progress Notes (Signed)
°   02/27/21 0655  Sleep  Number of Hours 6

## 2021-02-27 NOTE — Progress Notes (Addendum)
[facility] MD Progress Note  02/27/2021 6:06 PM Carlos Meyers  MRN: [id number]  CC: depression    Subjective: Carlos Meyers is a 36 y.o. male with PPHx of MDD with psychotic features, PTSD, and Alcohol use disorder who was admitted to [facility] for management and treatment of severe, recurrent, major depressive episode with suicidal ideation and command auditory hallucinations.  [place] Day 10   Overnight Events: No acute events overnight or behavioral issues noted in chart. Patient was compliant with scheduled meds, no agitation PRN's required, and attended group therapies appropriately. CIWA-Ar Total: 0  PRNs: Hydroxyzine 25mg  at 2152 on 2/1. Melatonin 3mg  at 2152 on 2/1. Seroquel 25mg  at 1811 on 2/1. Patient slept 6 hours.  Interim History: Patient was evaluated today. Patient was initially seen at [facility] on [address] hall, and was pleasant and cooperative with evaluation.  Patient reported feeling "tired but better overall". He notes that his roommate snored very badly throughout the night, and he therefore did not sleep well. He requests a room switch which we will try to accommodate today. However, Carlos Meyers notes that he is feeling much better from a depression stand point today, rating his depression as a 3/10, where 10/10 corresponds to his depression on admission. He denies any SI/HI or hearing any command hallucinations in the last two days. He notes that he hasn't heard his recently deceased friend/brother's voice since yesterday morning, which he understands is likely related to a normal grief reaction. He denies any visual hallucinations, paranoia, ideas of reference, or first rank symptoms.   He notes his anxiety is "really good," around a 1 or 2 out 10, where 10/10 is worst. Endorses good appetite.   Discussed monitoring his LFTs again tomorrow morning after discontinuing his Naltrexone (last dose received 2/1 AM). He denies any nausea, vomiting, diarrhea, abdominal pain,  chest pain, SOB, light headedness or headaches.   Patient denied medication side effects and is tolerating it well. Patient contracted for safety on the unit. Patient was not grossly responding to internal/external stimuli nor made any delusional statements during encounter. Otherwise, patient had no other concerns or questions and was amenable to the plan.  Diagnosis: Principal Problem:   Major depressive disorder, recurrent episode, severe, with psychosis ([place]) Active Problems:   Alcohol dependence with withdrawal ([place])   Tobacco abuse   PTSD (post-traumatic stress disorder)   Total Time spent with patient: I personally spent 30 minutes on the unit in direct patient care. The direct patient care time included face-to-face time with the patient, reviewing the patient's chart, communicating with other professionals, and coordinating care. Greater than 50% of this time was spent in counseling or coordinating care with the patient regarding goals of hospitalization, psycho-education, and discharge planning needs.   Past Psychiatric History: See H&P  Past Medical History:  Past Medical History:  Diagnosis Date   Hypertension    History reviewed. No pertinent surgical history. Family History:  Family History  Problem Relation Age of Onset   Healthy Mother    Healthy Father    Family Psychiatric History: See H&P Social History:  Social History   Substance and Sexual Activity  Alcohol Use Not Currently   Alcohol/week: 8.0 standard drinks   Types: 8 Standard drinks or equivalent per week     Social History   Substance and Sexual Activity  Drug Use No    Social History   Socioeconomic History   Marital status: Significant Other    Spouse name: Not  on file   Number of children: Not on file   Years of education: Not on file   Highest education level: Not on file  Occupational History   Not on file  Tobacco Use   Smoking status: Never   Smokeless tobacco: Never  Vaping  Use   Vaping Use: Never used  Substance and Sexual Activity   Alcohol use: Not Currently    Alcohol/week: 8.0 standard drinks    Types: 8 Standard drinks or equivalent per week   Drug use: No   Sexual activity: Not Currently    Birth control/protection: None  Other Topics Concern   Not on file  Social History Narrative   Not on file   Social Determinants of Health   Financial Resource Strain: Not on file  Food Insecurity: Not on file  Transportation Needs: Not on file  Physical Activity: Not on file  Stress: Not on file  Social Connections: Not on file    Appetite: Good  Current Medications: Current Facility-Administered Medications  Medication Dose Route Frequency Provider Last Rate Last Admin   acetaminophen (TYLENOL) tablet 650 mg  650 mg Oral Q6H PRN Maryagnes Amos, FNP   650 mg at 02/23/21 2056   alum & mag hydroxide-simeth (MAALOX/MYLANTA) 200-200-20 MG/5ML suspension 30 mL  30 mL Oral Q4H PRN Maryagnes Amos, FNP   30 mL at 02/17/21 2220   FLUoxetine (PROZAC) capsule 30 mg  30 mg Oral Daily Princess Bruins, DO   30 mg at 02/27/21 0102   gabapentin (NEURONTIN) capsule 300 mg  300 mg Oral TID Princess Bruins, DO   300 mg at 02/27/21 1701   hydrocortisone cream 1 %   Topical BID Princess Bruins, DO   Given at 02/27/21 7253   hydrOXYzine (ATARAX) tablet 25 mg  25 mg Oral TID PRN Maryagnes Amos, FNP   25 mg at 02/26/21 2152   loperamide (IMODIUM) capsule 2-4 mg  2-4 mg Oral PRN Comer Locket, MD       ziprasidone (GEODON) injection 20 mg  20 mg Intramuscular Q12H PRN Comer Locket, MD       And   LORazepam (ATIVAN) tablet 1 mg  1 mg Oral PRN Comer Locket, MD       LORazepam (ATIVAN) tablet 1 mg  1 mg Oral Q6H PRN Mason Jim, Gianlucas Evenson E, MD       magnesium hydroxide (MILK OF MAGNESIA) suspension 30 mL  30 mL Oral Daily PRN Starkes-Perry, Juel Burrow, FNP       melatonin tablet 3 mg  3 mg Oral QHS PRN Comer Locket, MD   3 mg at 02/26/21 2152    multivitamin with minerals tablet 1 tablet  1 tablet Oral Daily Comer Locket, MD   1 tablet at 02/27/21 6644   nicotine (NICODERM CQ - dosed in mg/24 hours) patch 14 mg  14 mg Transdermal Daily PRN Princess Bruins, DO       ondansetron (ZOFRAN-ODT) disintegrating tablet 4 mg  4 mg Oral Q6H PRN Mason Jim, Asir Bingley E, MD       QUEtiapine (SEROQUEL) tablet 200 mg  200 mg Oral QHS Mayerli Kirst E, MD   200 mg at 02/26/21 2147   QUEtiapine (SEROQUEL) tablet 25 mg  25 mg Oral TID PRN Princess Bruins, DO   25 mg at 02/26/21 1811   thiamine tablet 100 mg  100 mg Oral Daily Maryagnes Amos, FNP   100 mg at 02/27/21 [phone]  Lab Results:  Results for orders placed or performed during the hospital encounter of 02/17/21 (from the past 48 hour(s))  Hepatic function panel     Status: Abnormal   Collection Time: 02/25/21  6:49 PM  Result Value Ref Range   Total Protein 7.9 6.5 - 8.1 g/dL   Albumin 4.6 3.5 - 5.0 g/dL   AST 48 (H) 15 - 41 U/L   ALT 89 (H) 0 - 44 U/L   Alkaline Phosphatase 53 38 - 126 U/L   Total Bilirubin 0.7 0.3 - 1.2 mg/dL   Bilirubin, Direct 0.1 0.0 - 0.2 mg/dL   Indirect Bilirubin 0.6 0.3 - 0.9 mg/dL    Comment: Performed at [facility], [place] [address] [place], [place] [address]  Hepatitis panel, acute     Status: None   Collection Time: 02/26/21  6:20 PM  Result Value Ref Range   Hepatitis B Surface Ag NON REACTIVE NON REACTIVE   HCV Ab NON REACTIVE NON REACTIVE    Comment: (NOTE) Nonreactive HCV antibody screen is consistent with no HCV infections,  unless recent infection is suspected or other evidence exists to indicate HCV infection.     Hep A IgM NON REACTIVE NON REACTIVE   Hep B C IgM NON REACTIVE NON REACTIVE    Comment: Performed at [facility], [place] [address] [place], [place] [address]    Blood Alcohol level:  Lab Results  Component Value Date   ETH <10 02/15/2021   ETH 357 (HH) [id number]    Metabolic Disorder Labs: Lab  Results  Component Value Date   HGBA1C 5.5 02/18/2021   MPG 111 02/18/2021   No results found for: PROLACTIN Lab Results  Component Value Date   CHOL 183 02/18/2021   TRIG 240 (H) 02/18/2021   HDL 60 02/18/2021   CHOLHDL 3.1 02/18/2021   VLDL 48 (H) 02/18/2021   LDLCALC 75 02/18/2021    Physical Findings:  Musculoskeletal: Strength & Muscle Tone: within normal limits Gait & Station: normal Patient leans: N/A  Psychiatric Specialty Exam: Physical Exam Constitutional:      Appearance: Normal appearance.  HENT:     Head: Normocephalic.  Eyes:     General: No scleral icterus. Pulmonary:     Effort: Pulmonary effort is normal.  Neurological:     General: No focal deficit present.     Mental Status: He is alert.    Review of Systems  Constitutional:  Negative for appetite change, chills and fever.  Respiratory:  Negative for cough, chest tightness and shortness of breath.   Cardiovascular:  Negative for chest pain and palpitations.  Gastrointestinal:  Negative for abdominal pain, diarrhea, nausea and vomiting.  Neurological:  Negative for seizures.   Body mass index is 25.82 kg/m. Temp:  [98 F (36.7 C)] 98 F (36.7 C) (02/02 1655) Pulse Rate:  [62-78] 71 (02/02 1655) Resp:  [18] 18 (02/02 0805) BP: (122-135)/(69-81) 135/74 (02/02 1655) SpO2:  [100 %] 100 % (02/02 0805)  General Appearance:  Appropriate for Environment; Casual; Fairly Groomed    Eye Contact:   Good    Speech:   Clear and Coherent; Normal Rate    Volume:   Normal    Mood:   Appears more euthymic - described as tired    Affect:   Less constricted    Thought Process:   Coherent; Goal Directed; Linear   Descriptions of Associations: Intact   Orientation:   Full (Time, Place and Person)  Thought Content:   Logical - denies AVH, paranoia, delusions, ideas of reference or first rank symptoms; is not grossly responding to internal/external stimuli on exam   Suicidal Thoughts:    Denied    Homicidal Thoughts:   Denied    Memory:   Immediate Good; Recent Fair; Remote Fair    Judgement:   Fair    Insight:   Fair    Psychomotor Activity:   Normal    Concentration:   Good    Attention Span:   Good   Recall:   Good    Fund of Knowledge:   Good    Language:   Good    Akathisia:  No  Handed:   Right    Assets:   Communication Skills; Desire for Improvement; Financial Resources/Insurance; Housing; Intimacy; Leisure Time; Vocational/Educational; Resilience; Social Support    ADL's:  Intact  Cognition:  WNL    AIMS: Facial and Oral Movements Muscles of Facial Expression: None, normal Lips and Perioral Area: None, normal Jaw: None, normal Tongue: None, normal,Extremity Movements Upper (arms, wrists, hands, fingers): None, normal Lower (legs, knees, ankles, toes): None, normal, Trunk Movements Neck, shoulders, hips: None, normal, Overall Severity Severity of abnormal movements (highest score from questions above): None, normal Incapacitation due to abnormal movements: None, normal Patient's awareness of abnormal movements (rate only patient's report): No Awareness, Dental Status Current problems with teeth and/or dentures?: No Does patient usually wear dentures?: No   CIWA:  CIWA-Ar Total: 0     Treatment Assessment & Plan Summary: Principal Problem:   Major depressive disorder, recurrent episode, severe, with psychosis ([place]) Active Problems:   Alcohol dependence with withdrawal ([place])   Tobacco abuse   PTSD (post-traumatic stress disorder)   Stevenson T Pentecost is a 36 y.o. male with PPHx of MDD with psychotic features, PTSD, and alcohol use disorder who presented via sheriff's car for SI with plan and CAH, then admitted to [facility] for management and treatment of severe, recurrent major depressive episode with suicidal ideation and CAH.  [place] Day 10   PLAN: Severe, recurrent Major depressive episode with psychotic  features No CAH or hypnopompic hallucinations since 1/31 when nighttime dose of Seroquel was increased to 200mg . Depression much improved today at 3/10. No SI/HI/AVH. No QTC prolongation between 1/23 and 1/31 EKGs (last at 467ms). A1c at 5.5, Lipid panel WNL except Triglycerides at 240 and VLDL at 48. -Continue Prozac 30mg   -Continue Seroquel 200mg  nightly for psychosis and to augment for depressive/anxious symptoms, and continue Seroquel 25mg  TID PRN for anxiety (pt encouraged towards judicious use per elevated LFTs, noted below).  Elevated liver transaminases AST 48 and ALT 89 on 1/31 labs. Began Naltrexone on 1/29, and received last dose on 2/1, now discontinued.  Hepatitis panel with all viral strains non-reactive on 2/1.  -Rechecking LFTs tomorrow morning, 2/3.  Alcohol use disorder Pt with daily alcohol use for 16 years and completed Ativan taper during admission for mild-moderate withdrawal symptoms. Last CIWA scores 1 and  Pt has remained determinedly motivated towards complete EtOH cessation. Discontinued Naltrexone, as noted above. -CIWA protocol with Ativan prn for scores>10 -Continue Gabapentin 300mg  TID  -Continue Thiamine and multivitamin daily  PTSD  Pt without current nighttime awakening episodes since beginning Seroquel. Previously had hypervigilant nighttime awakenings characterized by diaphoresis, palpitations and tearfulness.  -Prozac and Seroquel, as above -Recommend trauma focused therapy after discharge  Hypertriglyceridemia As noted above. -Will need f/u with PCP after discharge with healthy diet and  increased exercise as outpatient  Dispo: Patient, No Pcp Per (Inactive) After discharge, recommend f/u with CBT, Trauma therapy and AA meetings Will need to be established with PCP and recommend f/u of liver function panel, hypertriglyceridemia, and receipt of Hep B vaccine (negative Hep B surface antigen antibodies on 2/1 Hepatitis labs, which were all  non-reactive)  Safety, monitoring and disposition planning: The patient was seen and evaluated on the unit.  The patient's chart was reviewed and nursing notes were reviewed.  The patient's case was discussed in multidisciplinary team meeting.  Plan and drug side effects were discussed with patient who was amendable. Social work and case management to assist with discharge planning and identification of hospital follow-up needs prior to discharge. Discharge Concerns: Need to establish a safety plan; Medication compliance and effectiveness Discharge Goals: Return home with outpatient referrals for mental health follow-up including medication management/psychotherapy Safety and Monitoring: Involuntary status at inpatient psychiatric unit for safety, stabilization and treatment Daily contact with patient to assess and evaluate symptoms and progress in treatment and medical management Patient's case to be discussed in multi-disciplinary team meeting Observation Level: Per above Vital signs:  q12 hours Precautions: Per chart  Signed: Baldwin [place], [place] 02/27/2021, 6:06 PM  Attestation for Student Documentation:   I certify that I saw and interviewed the patient. I reviewed the medical record.  I performed or reperformed the mental status examination of the patient as indicated.  I formulated the assessment and plan of treatment as documented above. Viann Fish, MD, Alda Ponder

## 2021-02-27 NOTE — Progress Notes (Signed)
°   02/26/21 2200  Psych Admission Type (Psych Patients Only)  Admission Status Involuntary  Psychosocial Assessment  Patient Complaints Anxiety;Depression  Eye Contact Brief  Facial Expression Sad  Affect Depressed;Sad  Speech Logical/coherent  Interaction Minimal  Motor Activity Other (Comment) (WDL)  Appearance/Hygiene Unremarkable  Behavior Characteristics Cooperative;Anxious  Mood Anxious;Depressed  Thought Process  Coherency WDL  Content WDL  Delusions None reported or observed  Perception WDL  Hallucination None reported or observed  Judgment Impaired  Confusion None  Danger to Self  Current suicidal ideation? Denies  Self-Injurious Behavior No self-injurious ideation or behavior indicators observed or expressed   Agreement Not to Harm Self Yes  Description of Agreement Verbal contract  Danger to Others  Danger to Others None reported or observed

## 2021-02-28 ENCOUNTER — Encounter (HOSPITAL_COMMUNITY): Payer: Self-pay

## 2021-02-28 LAB — HEPATIC FUNCTION PANEL
ALT: 63 U/L — ABNORMAL HIGH (ref 0–44)
AST: 29 U/L (ref 15–41)
Albumin: 4.2 g/dL (ref 3.5–5.0)
Alkaline Phosphatase: 52 U/L (ref 38–126)
Bilirubin, Direct: 0.1 mg/dL (ref 0.0–0.2)
Indirect Bilirubin: 0.9 mg/dL (ref 0.3–0.9)
Total Bilirubin: 1 mg/dL (ref 0.3–1.2)
Total Protein: 7.1 g/dL (ref 6.5–8.1)

## 2021-02-28 MED ORDER — FLUOXETINE HCL 10 MG PO CAPS
30.0000 mg | ORAL_CAPSULE | Freq: Every day | ORAL | 0 refills | Status: DC
Start: 2021-02-28 — End: 2023-01-21

## 2021-02-28 MED ORDER — HYDROCORTISONE 1 % EX CREA: TOPICAL_CREAM | Freq: Two times a day (BID) | CUTANEOUS | 0 refills | Status: DC

## 2021-02-28 MED ORDER — NICOTINE 14 MG/24HR TD PT24: 14.0000 mg | MEDICATED_PATCH | Freq: Every day | TRANSDERMAL | 0 refills | Status: DC

## 2021-02-28 MED ORDER — GABAPENTIN 300 MG PO CAPS
300.0000 mg | ORAL_CAPSULE | Freq: Three times a day (TID) | ORAL | 0 refills | Status: DC | PRN
Start: 2021-02-28 — End: 2023-01-21

## 2021-02-28 MED ORDER — MELATONIN 3 MG PO TABS: 3.0000 mg | ORAL_TABLET | Freq: Every evening | ORAL | 0 refills | Status: DC | PRN

## 2021-02-28 MED ORDER — QUETIAPINE FUMARATE 200 MG PO TABS
200.0000 mg | ORAL_TABLET | Freq: Every day | ORAL | 0 refills | Status: DC
Start: 2021-02-28 — End: 2023-06-24

## 2021-02-28 NOTE — BHH Suicide Risk Assessment (Signed)
[facility] Discharge Suicide Risk Assessment   Principal Problem: Major depressive disorder, recurrent episode, severe, with psychosis ([place]) Discharge Diagnoses: Principal Problem:   Major depressive disorder, recurrent episode, severe, with psychosis ([place]) Active Problems:   Alcohol dependence with withdrawal ([place])   Tobacco abuse   PTSD (post-traumatic stress disorder)  Total Time Spent in Direct Patient Care:  I personally spent 35 minutes on the unit in direct patient care. The direct patient care time included face-to-face time with the patient, reviewing the patient's chart, communicating with other professionals, and coordinating care. Greater than 50% of this time was spent in counseling or coordinating care with the patient regarding goals of hospitalization, psycho-education, and discharge planning needs.  Subjective: The patient was seen today on rounds with house officer.  He reports that he did not sleep very well last night due to having a noisy roommate.  He otherwise voices no physical complaints and reports stable appetite.  He denies medication side effects.  He reports stable and improved mood and denies SI or HI.  He can articulate a safety and discharge plan and is forward thinking today.  He denies AVH, command voices, delusions, paranoia, ideas of reference or first rank symptoms.  He was educated on the fact that he will need ongoing metabolic lab monitoring, weight monitoring,EKG, CBC and AIMS monitoring while on Seroquel.  He was encouraged to abstain from alcohol and illicit drug use after discharge.  He was made aware that his liver panel was mildly elevated and has been repeated with results pending today.  He was encouraged to follow-up with a primary care provider as an outpatient for repeat liver panel and to avoid alcohol and excessive amounts of Tylenol after discharge.  He was also made aware that his triglycerides were mildly elevated and he should have this repeated by  primary care provider, exercise more, and eat a healthier diet after discharge.  Time was given for questions.   Strength & Muscle Tone: within normal limits Gait & Station: normal Patient leans: N/A  Psychiatric Specialty Exam  Presentation  General Appearance: Appropriate for Environment; Casual; Fairly Groomed  Eye Contact:Good  Speech:Clear and Coherent; Normal Rate  Speech Volume:Normal  Mood and Affect  Mood:more euthymic, calm  Affect: mildly constricted but brighter overall  Thought Process  Thought Processes:Coherent; Goal Directed; Linear  Descriptions of Associations:Intact  Orientation:Full (Time, Place and Person)  Thought Content:Logical - no AVH, paranoia, delusions, ideas of reference or first rank symptoms   Hallucinations: Denied  Ideas of Reference:None  Suicidal Thoughts:Suicidal Thoughts: No  Homicidal Thoughts:Homicidal Thoughts: No   Sensorium  Memory:Immediate Good; Recent Fair; Remote Fair  Judgment:Fair  Insight:Fair   Executive Functions  Concentration:Good  Attention Span:Good  [place] of Knowledge:Good  Language:Good   Psychomotor Activity  Psychomotor Activity:Normal - no stiffness, no tremor, no akathisia, no cogwheeling, AIMS 0   Assets  Assets:Communication Skills; Desire for Improvement; Financial Resources/Insurance; Housing; Intimacy; Leisure Time; Vocational/Educational; Resilience; Social Support   Physical Exam Vitals reviewed.  HENT:     Head: Normocephalic.  Pulmonary:     Effort: Pulmonary effort is normal.  Neurological:     General: No focal deficit present.     Mental Status: He is alert.   Review of Systems  Respiratory:  Negative for shortness of breath.   Cardiovascular:  Negative for chest pain.  Gastrointestinal:  Negative for diarrhea, nausea and vomiting.  Blood pressure 113/82, pulse 71, temperature 97.6 F (36.4 C), resp. rate 17,  height 5\' 6"  (1.676 m), weight 72.6 kg,  SpO2 99 %. Body mass index is 25.82 kg/m.  Mental Status Per Nursing Assessment::   On Admission:  Suicidal ideation indicated by patient, Self-harm thoughts - resolved  Demographic Factors:  Male  Loss Factors: Loss of significant relationship  Historical Factors: Substance use prior to admission, previous psychiatric diagnoses/treatments, impulsivity  Risk Reduction Factors:   Sense of responsibility to family, Living with another person, especially a relative, Positive social support, and Positive coping skills or problem solving skills, has 2 children  Continued Clinical Symptoms:  Depression:   Impulsivity Alcohol/Substance Abuse/Dependencies Previous Psychiatric Diagnoses and Treatments More than one psychiatric diagnosis  Cognitive Features That Contribute To Risk:  None    Suicide Risk:  Mild: There are no identifiable plans, no associated intent, mild dysphoria and related symptoms, some other risk factors, and identifiable protective factors, including available and accessible social support.   [facility] Follow up.   Specialty: Behavioral Health Why: You may go to this provider for therapy and medication management services during walk in hours:  Monday through Wednesday from 7:45 am to 11:00 am.  Services are provided on a first come, first served basis. Contact information: [place] [phone]        Disability education and apply. Go to.   Why: Please go to website to apply for disability and learn additional information about getting disability. Contact information: [email or web address]        [place]. Schedule an appointment as soon as possible for a visit.   Why: Please call to schedule a hospital discharge appointment for primary care services as soon as possible after discharge. Contact information: [place] [phone]                Plan Of Care/Follow-up recommendations:  Activity:  as tolerated Diet:  heart healthy Other:  Patient advised to abstain from alcohol and illicit drug use and to keep scheduled outpatient mental health appointments. He was advised to comply with medications and was educated on need to have ongoing monitoring of his lipids, glucose, weight, EKG, CBC, and AIMS while on Seroquel. He was advised his triglycerides are elevated and he should eat healthier, exercise, and have this rechecked by primary care provider without fail after discharge. He was also made aware that his liver enzymes were mildly elevated and he should avoid large amounts of Tylenol or use of alcohol and should have these rechecked by a primary care provider without fail after discharge.  Harlow Asa, MD, FAPA 02/28/2021, 7:29 AM

## 2021-02-28 NOTE — Plan of Care (Signed)
Nursing discharge note: Patient discharged home per MD order.  Patient received all personal belongings from unit and locker.  Reviewed AVS/transition record with patient and he indicates understanding.  Patient will follow up with [facility]. Patient denies any thoughts of self harm.  Patient left with all prescriptions in hand. He left ambulatory with [employer]. Patient went willingly with law enforcement.
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
## 2021-02-28 NOTE — Progress Notes (Signed)
°   02/27/21 2232  Psych Admission Type (Psych Patients Only)  Admission Status Involuntary  Psychosocial Assessment  Patient Complaints Depression  Eye Contact Brief  Facial Expression Sad;Flat  Affect Depressed;Sad  Speech Logical/coherent  Interaction Minimal  Motor Activity Other (Comment) (WDL)  Appearance/Hygiene Unremarkable  Behavior Characteristics Cooperative  Mood Depressed  Aggressive Behavior  Effect No apparent injury  Thought Process  Coherency WDL  Content WDL  Delusions None reported or observed  Perception WDL  Hallucination None reported or observed  Judgment Impaired  Confusion None  Danger to Self  Current suicidal ideation? Denies  Self-Injurious Behavior No self-injurious ideation or behavior indicators observed or expressed   Agreement Not to Harm Self Yes  Description of Agreement Verbal contract  Danger to Others  Danger to Others None reported or observed

## 2021-02-28 NOTE — Progress Notes (Signed)
°   02/28/21 0800  Psych Admission Type (Psych Patients Only)  Admission Status Involuntary  Psychosocial Assessment  Patient Complaints None  Eye Contact Brief  Facial Expression Flat  Affect Appropriate to circumstance  Speech Logical/coherent  Interaction Minimal  Motor Activity Other (Comment) (wnl)  Appearance/Hygiene Unremarkable  Behavior Characteristics Cooperative  Mood Pleasant  Thought Process  Coherency WDL  Content WDL  Delusions None reported or observed  Perception WDL  Hallucination None reported or observed  Judgment Impaired  Confusion None  Danger to Self  Current suicidal ideation? Denies  Self-Injurious Behavior No self-injurious ideation or behavior indicators observed or expressed   Agreement Not to Harm Self Yes  Description of Agreement verbal  Danger to Others  Danger to Others None reported or observed

## 2021-02-28 NOTE — BH IP Treatment Plan (Signed)
Interdisciplinary Treatment and Diagnostic Plan Update  02/28/2021 Carlos Meyers MRN: [id number]  Principal Diagnosis: Major depressive disorder, recurrent episode, severe, with psychosis ([place])  Secondary Diagnoses: Principal Problem:   Major depressive disorder, recurrent episode, severe, with psychosis ([place]) Active Problems:   Alcohol dependence with withdrawal ([place])   Tobacco abuse   PTSD (post-traumatic stress disorder)   Current Medications:  Current Facility-Administered Medications  Medication Dose Route Frequency Provider Last Rate Last Admin   acetaminophen (TYLENOL) tablet 650 mg  650 mg Oral Q6H PRN Suella Broad, FNP   650 mg at 02/23/21 2056   alum & mag hydroxide-simeth (MAALOX/MYLANTA) 200-200-20 MG/5ML suspension 30 mL  30 mL Oral Q4H PRN Suella Broad, FNP   30 mL at 02/17/21 2220   FLUoxetine (PROZAC) capsule 30 mg  30 mg Oral Daily Merrily Brittle, DO   30 mg at 02/28/21 0947   gabapentin (NEURONTIN) capsule 300 mg  300 mg Oral TID Merrily Brittle, DO   300 mg at 02/28/21 [id number]   hydrocortisone cream 1 %   Topical BID Merrily Brittle, DO   Given at 02/27/21 [id number]   hydrOXYzine (ATARAX) tablet 25 mg  25 mg Oral TID PRN Suella Broad, FNP   25 mg at 02/27/21 2108   loperamide (IMODIUM) capsule 2-4 mg  2-4 mg Oral PRN Harlow Asa, MD       ziprasidone (GEODON) injection 20 mg  20 mg Intramuscular Q12H PRN Harlow Asa, MD       And   LORazepam (ATIVAN) tablet 1 mg  1 mg Oral PRN Harlow Asa, MD       LORazepam (ATIVAN) tablet 1 mg  1 mg Oral Q6H PRN Nelda Marseille, Amy E, MD       magnesium hydroxide (MILK OF MAGNESIA) suspension 30 mL  30 mL Oral Daily PRN Starkes-Perry, Gayland Curry, FNP       melatonin tablet 3 mg  3 mg Oral QHS PRN Harlow Asa, MD   3 mg at 02/27/21 2108   multivitamin with minerals tablet 1 tablet  1 tablet Oral Daily Harlow Asa, MD   1 tablet at 02/28/21 1028   nicotine (NICODERM CQ - dosed in mg/24 hours)  patch 14 mg  14 mg Transdermal Daily PRN Merrily Brittle, DO       ondansetron (ZOFRAN-ODT) disintegrating tablet 4 mg  4 mg Oral Q6H PRN Nelda Marseille, Amy E, MD       QUEtiapine (SEROQUEL) tablet 200 mg  200 mg Oral QHS Singleton, Amy E, MD   200 mg at 02/27/21 2106   QUEtiapine (SEROQUEL) tablet 25 mg  25 mg Oral TID PRN Merrily Brittle, DO   25 mg at 02/26/21 1811   thiamine tablet 100 mg  100 mg Oral Daily Suella Broad, FNP   100 mg at 02/28/21 [id number]   PTA Medications: No medications prior to admission.    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   Physician Treatment Plan for Primary Diagnosis: Major depressive disorder, recurrent episode, severe, with psychosis ([place]) Long Term Goal(s):     Short Term Goals:    Medication Management: Evaluate patient's response, side effects, and  tolerance of medication regimen.  Therapeutic Interventions: 1 to 1 sessions, Unit Group sessions and Medication administration.  Evaluation of Outcomes: Adequate for Discharge  Physician Treatment Plan for Secondary Diagnosis: Principal Problem:   Major depressive disorder, recurrent episode, severe, with psychosis ([place]) Active Problems:   Alcohol dependence with withdrawal ([place])   Tobacco abuse   PTSD (post-traumatic stress disorder)  Long Term Goal(s):     Short Term Goals:       Medication Management: Evaluate patient's response, side effects, and tolerance of medication regimen.  Therapeutic Interventions: 1 to 1 sessions, Unit Group sessions and Medication administration.  Evaluation of Outcomes: Adequate for  Discharge   RN Treatment Plan for Primary Diagnosis: Major depressive disorder, recurrent episode, severe, with psychosis ([place]) Long Term Goal(s): Knowledge of disease and therapeutic regimen to maintain health will improve  Short Term Goals: Ability to disclose and discuss suicidal ideas, Ability to identify and develop effective coping behaviors will improve, and Compliance with prescribed medications will improve  Medication Management: RN will administer medications as ordered by provider, will assess and evaluate patient's response and provide education to patient for prescribed medication. RN will report any adverse and/or side effects to prescribing provider.  Therapeutic Interventions: 1 on 1 counseling sessions, Psychoeducation, Medication administration, Evaluate responses to treatment, Monitor vital signs and CBGs as ordered, Perform/monitor CIWA, COWS, AIMS and Fall Risk screenings as ordered, Perform wound care treatments as ordered.  Evaluation of Outcomes: Adequate for Discharge   LCSW Treatment Plan for Primary Diagnosis: Major depressive disorder, recurrent episode, severe, with psychosis ([place]) Long Term Goal(s): Safe transition to appropriate next level of care at discharge, Engage patient in therapeutic group addressing interpersonal concerns.  Short Term Goals: Engage patient in aftercare planning with referrals and resources, Increase ability to appropriately verbalize feelings, and Increase emotional regulation  Therapeutic Interventions: Assess for all discharge needs, 1 to 1 time with Social worker, Explore available resources and support systems, Assess for adequacy in community support network, Educate family and significant other(s) on suicide prevention, Complete Psychosocial Assessment, Interpersonal group therapy.  Evaluation of Outcomes: Adequate for Discharge   Progress in Treatment: Attending groups: Yes. Participating in groups: Yes. Taking medication as  prescribed: Yes. Toleration medication: Yes. Family/Significant other contact made: Yes, individual(s) contacted:  girlfriend Patient understands diagnosis: Yes. Discussing patient identified problems/goals with staff: Yes. Medical problems stabilized or resolved: Yes. Denies suicidal/homicidal ideation: Yes. Issues/concerns per patient self-inventory: No. Other: None  New problem(s) identified: No, Describe:  None  New Short Term/Long Term Goal(s):medication stabilization, elimination of SI thoughts, development of comprehensive mental wellness plan.  Patient Goals:   "To get better"  Discharge Plan or Barriers: Pt will d/c to jail due to order to disclose medical records. Pt will f/u at [facility] for medication management and therapy.   Reason for Continuation of Hospitalization: Depression Medication stabilization Suicidal ideation  Estimated Length of Stay: 3-5 days   Scribe for Treatment Team: Eliott Nine 02/28/2021 10:54 AM

## 2021-02-28 NOTE — Progress Notes (Signed)
°  [facility] Adult Case Management Discharge Plan :  Will you be returning to the same living situation after discharge:  No. Patient will be discharged to the [place] for duty to inform.  At discharge, do you have transportation home?: Yes,  [place] Do you have the ability to pay for your medications: Yes,  patient provided resources for ongoing follow up  Release of information consent forms completed and in the chart;  Patient's signature needed at discharge.  Patient to Follow up at:  [facility].   Specialty: Behavioral Health Why: You may go to this provider for therapy and medication management services during walk in hours:  Monday through Wednesday from 7:45 am to 11:00 am.  Services are provided on a first come, first served basis. Contact information: [place] [phone]        Disability education and apply. Go to.   Why: Please go to website to apply for disability and learn additional information about getting disability. Contact information: [email or web address]        [place]. Schedule an appointment as soon as possible for a visit.   Why: Please call to schedule a hospital discharge appointment for primary care services as soon as possible after discharge. Contact information: [address] [place] [phone]                Next level of care provider has access to [place] and Suicide Prevention discussed: Yes,  wife/girlfriend     Has patient been referred to the Quitline?: N/A patient is not a smoker  Patient has been referred for addiction treatment: Yes, provided resources for follow up at a later date.   Bruce, LCSW 02/28/2021, 11:58 AM

## 2021-02-28 NOTE — Discharge Instructions (Addendum)
Dear Carlos Meyers, I am so glad you are feeling better and can be discharged today!  Please see below for doctor or provider within 1-2 WEEKS of leaving for a [place] and see below for any other follow-up appointments.  Additional Instructions: Diagnosis:  Principal Problem:   Major depressive disorder, recurrent episode, severe, with psychosis (Carlos Meyers) Active Problems:   Alcohol dependence with withdrawal (Carlos Meyers)   Tobacco abuse   PTSD (post-traumatic stress disorder)    If you are experiencing side effects, please DO NOT STOP your meds, rather see your psychiatrist first to change it.  Meds: Fluoxetine 30 mg  Please note and rate your symptoms of depression and anxiety after 1 month of dose change. If depression and anxiety are still there, please see your doctor to increase the dose after 1 month. If makes you sleepy during the day, please take at night. If it makes you feel too alerted, please take in the morning.  Any side effects including nausea, vomiting, stomach aches, and diarrhea or constipation may occur with increasing doses for a few days to 1 week and will go away once your body is used to it. If there are sexual side effects, please do not stop the med, rather talk to your doctor to change or alter the med to resolve that symptom. Max dose is 60 mg Alcohol Abuse Please consider talking about naltrexone with your doctor outside the hospital. Naltrexone can cause liver inflammation, however please balance this risk with liver damage that results from alcohol abuse. When taking naltrexone, you will need regular lab blood work to monitor your liver and make adjustments or stop as necessary to prevent permanent damage.  Continue gabapentin for help with alcohol craving and anxiety. This med was scheduled 3 times every day, however, because it can cause daytime sleepiness, feel free to take this med as needed. Seroquel 200 mg nightly This is an antipsychotic that  is used to help with your hallucination from alcohol withdrawal and depression as well as a sleep aid and making your depression medicine (fluoxetine) work better. This med can make you gain weight, raise your cholesterol and blood sugar. I recommend that you continue with this med for at least 6 months to 1 year to prevent the hallucinations from coming back. However if you want to continue on a lower dose for the next 6 months to 1 year, please talk to your doctor.  Therapy:  Please CALL the resources provided for therapy to make an appointment Recommend that you continue therapy for at least 5 years Examples of therapy to consider: Cognitive behavioral therapy (CBT) Dialectical behavior therapy (DBT) Trauma therapy Motivational enhancement therapy Solution-focused brief therapy Psychodynamic therapy Interpersonal therapy Group therapy AA EMERGENCY: National Suicide Hotline: Call [address] and Text for Suicide: Brewing technologist and Text 911 for ED [facility] Address: [address], [place], [place] [address] Please see your discharge packet for other resources  It was a pleasure meeting you, Carlos Meyers. I wish you the best, and hope you stay happy and healthy!  Merrily Brittle, DO 02/28/2021
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
## 2023-01-21 ENCOUNTER — Ambulatory Visit (HOSPITAL_COMMUNITY)
Admission: EM | Admit: 2023-01-21 | Discharge: 2023-01-21 | Disposition: A | Discharge status: Home / Self Care | Payer: Self-pay | Attending: Internal Medicine | Admitting: Internal Medicine

## 2023-01-21 ENCOUNTER — Encounter (HOSPITAL_COMMUNITY): Payer: Self-pay | Admitting: Emergency Medicine

## 2023-01-21 DIAGNOSIS — K610 Anal abscess: Secondary | ICD-10-CM

## 2023-01-21 MED ORDER — HYDROCODONE-ACETAMINOPHEN 5-325 MG PO TABS
1.0000 | ORAL_TABLET | Freq: Once | ORAL | Status: AC
  Administered 2023-01-21: 1 via ORAL

## 2023-01-21 MED ORDER — IBUPROFEN 800 MG PO TABS: 800.0000 mg | ORAL_TABLET | Freq: Three times a day (TID) | ORAL | 0 refills | Status: DC | PRN

## 2023-01-21 MED ORDER — AMOXICILLIN-POT CLAVULANATE 875-125 MG PO TABS: 1.0000 | ORAL_TABLET | Freq: Two times a day (BID) | ORAL | 0 refills | Status: DC

## 2023-01-21 MED ORDER — HYDROCODONE-ACETAMINOPHEN 5-325 MG PO TABS
ORAL_TABLET | ORAL | Status: AC
  Filled 2023-01-21: qty 1

## 2023-01-21 NOTE — ED Provider Notes (Signed)
[facility]    CSN: [id number] Arrival date & time: 01/21/23  1646      History   Chief Complaint Chief Complaint  Patient presents with   Abscess    HPI Carlos Meyers is a 37 y.o. male with a history of recurrent buttocks abscesses comes to urgent care with a 4-day history of painful swelling in the right buttocks.  No fever or chills.  Pain is throbbing, constant and aggravated by palpation.  It is not relieved by over-the-counter medications.  No discharge or drainage from the site.   HPI  Past Medical History:  Diagnosis Date   Hypertension     Patient Active Problem List   Diagnosis Date Noted   PTSD (post-traumatic stress disorder) 02/25/2021   Major depressive disorder, recurrent episode, severe, with psychosis ([facility]) 02/18/2021   Alcohol dependence with withdrawal ([facility]) 02/18/2021   Tobacco abuse 02/18/2021    History reviewed. No pertinent surgical history.     Home Medications    Prior to Admission medications   Medication Sig Start Date End Date Taking? Authorizing Provider  amoxicillin-clavulanate (AUGMENTIN) 875-125 MG tablet Take 1 tablet by mouth every 12 (twelve) hours. 01/21/23  Yes Dameka Younker, Britta Mccreedy, MD  ibuprofen (ADVIL) 800 MG tablet Take 1 tablet (800 mg total) by mouth every 8 (eight) hours as needed. 01/21/23  Yes Deryl Giroux, Britta Mccreedy, MD  QUEtiapine (SEROQUEL) 200 MG tablet Take 1 tablet (200 mg total) by mouth at bedtime. 02/28/21 03/30/21  Princess Bruins, DO    Family History Family History  Problem Relation Age of Onset   Healthy Mother    Healthy Father     Social History Social History   Tobacco Use   Smoking status: Never   Smokeless tobacco: Never  Vaping Use   Vaping status: Never Used  Substance Use Topics   Alcohol use: Not Currently    Alcohol/week: 8.0 standard drinks of alcohol    Types: 8 Standard drinks or equivalent per week   Drug use: No     Allergies   Neomycin   Review of Systems Review of  Systems As per HPI  Physical Exam Triage Vital Signs ED Triage Vitals  Encounter Vitals Group     BP 01/21/23 1744 (!) 151/81     Systolic BP Percentile --      Diastolic BP Percentile --      Pulse Rate 01/21/23 1744 71     Resp 01/21/23 1744 16     Temp 01/21/23 1744 98.3 F (36.8 C)     Temp Source 01/21/23 1744 Oral     SpO2 01/21/23 1744 97 %     Weight --      Height --      Head Circumference --      Peak Flow --      Pain Score 01/21/23 1743 8     Pain Loc --      Pain Education --      Exclude from Growth Chart --    No data found.  Updated Vital Signs BP (!) 151/81 (BP Location: Left Arm)   Pulse 71   Temp 98.3 F (36.8 C) (Oral)   Resp 16   SpO2 97%   Visual Acuity Right Eye Distance:   Left Eye Distance:   Bilateral Distance:    Right Eye Near:   Left Eye Near:    Bilateral Near:     Physical Exam Vitals and nursing note reviewed.  Constitutional:  General: He is not in acute distress.    Appearance: He is not ill-appearing.  Cardiovascular:     Rate and Rhythm: Normal rate and regular rhythm.  Musculoskeletal:        General: Normal range of motion.  Skin:    Comments: Tender perianal mass.  Indurated area measures about 2 inches and there is an area of fluctuance measuring about 1 inch in the longest diameter.  No surrounding erythema.  Neurological:     Mental Status: He is alert.      UC Treatments / Results  Labs (all labs ordered are listed, but only abnormal results are displayed) Labs Reviewed - No data to display  EKG   Radiology No results found.  Procedures Incision and Drainage  Date/Time: 01/21/2023 6:47 PM  Performed by: Merrilee Jansky, MD Authorized by: Merrilee Jansky, MD   Consent:    Consent obtained:  Verbal   Consent given by:  Patient   Risks discussed:  Bleeding and incomplete drainage Universal protocol:    Patient identity confirmed:  Verbally with patient Location:    Type:  Abscess    Location:  Anogenital   Anogenital location:  Perianal Pre-procedure details:    Skin preparation:  Antiseptic wash and povidone-iodine Anesthesia:    Anesthesia method:  Local infiltration   Local anesthetic:  Lidocaine 2% WITH epi Procedure type:    Complexity:  Simple Procedure details:    Incision types:  Single straight   Incision depth:  Subcutaneous   Drainage:  Purulent and bloody   Drainage amount:  Moderate   Wound treatment:  Wound left open   Packing materials:  None Post-procedure details:    Procedure completion:  Tolerated well, no immediate complications  (including critical care time)  Medications Ordered in UC Medications  HYDROcodone-acetaminophen (NORCO/VICODIN) 5-325 MG per tablet 1 tablet (1 tablet Oral Given 01/21/23 1841)    Initial Impression / Assessment and Plan / UC Course  I have reviewed the triage vital signs and the nursing notes.  Pertinent labs & imaging results that were available during my care of the patient were reviewed by me and considered in my medical decision making (see chart for details).     1.  Perianal abscess: Incision and drainage completed Sitz bath's 2-3 times a day Hydrocodone-acetaminophen x 1 dose given during the visit Ibuprofen 800 mg every 8 hours as needed for pain Augmentin 875-125 mg twice daily for 7 days Return precautions given. Final Clinical Impressions(s) / UC Diagnoses   Final diagnoses:  Abscess, perianal     Discharge Instructions      Sitz bath's 2-3 times a day Take antibiotics as directed Take pain medications as directed If you have worsening symptoms please return to urgent care to be reevaluated.   ED Prescriptions     Medication Sig Dispense Auth. Provider   ibuprofen (ADVIL) 800 MG tablet Take 1 tablet (800 mg total) by mouth every 8 (eight) hours as needed. 21 tablet Annarose Ouellet, Britta Mccreedy, MD   amoxicillin-clavulanate (AUGMENTIN) 875-125 MG tablet Take 1 tablet by mouth every 12  (twelve) hours. 14 tablet Rylen Swindler, Britta Mccreedy, MD      PDMP not reviewed this encounter.   Merrilee Jansky, MD 01/21/23 [phone]

## 2023-01-21 NOTE — ED Triage Notes (Signed)
Pt c/o boil on right buttocks for 4 days.

## 2023-01-21 NOTE — Discharge Instructions (Signed)
Sitz bath's 2-3 times a day Take antibiotics as directed Take pain medications as directed If you have worsening symptoms please return to urgent care to be reevaluated.

## 2023-06-24 ENCOUNTER — Encounter (HOSPITAL_COMMUNITY): Payer: Self-pay

## 2023-06-24 ENCOUNTER — Ambulatory Visit (HOSPITAL_COMMUNITY)
Admission: EM | Admit: 2023-06-24 | Discharge: 2023-06-24 | Disposition: A | Discharge status: Home / Self Care | Payer: Self-pay

## 2023-06-24 DIAGNOSIS — R519 Headache, unspecified: Secondary | ICD-10-CM

## 2023-06-24 MED ORDER — SUMATRIPTAN SUCCINATE 6 MG/0.5ML ~~LOC~~ SOLN
SUBCUTANEOUS | Status: AC
  Filled 2023-06-24: qty 0.5

## 2023-06-24 MED ORDER — DEXAMETHASONE SODIUM PHOSPHATE 10 MG/ML IJ SOLN
INTRAMUSCULAR | Status: AC
  Filled 2023-06-24: qty 1

## 2023-06-24 MED ORDER — KETOROLAC TROMETHAMINE 30 MG/ML IJ SOLN
30.0000 mg | Freq: Once | INTRAMUSCULAR | Status: AC
  Administered 2023-06-24: 30 mg via INTRAMUSCULAR

## 2023-06-24 MED ORDER — KETOROLAC TROMETHAMINE 30 MG/ML IJ SOLN
INTRAMUSCULAR | Status: AC
  Filled 2023-06-24: qty 1

## 2023-06-24 MED ORDER — SUMATRIPTAN SUCCINATE 6 MG/0.5ML ~~LOC~~ SOLN
6.0000 mg | Freq: Once | SUBCUTANEOUS | Status: AC
  Administered 2023-06-24: 6 mg via SUBCUTANEOUS

## 2023-06-24 MED ORDER — DEXAMETHASONE SODIUM PHOSPHATE 10 MG/ML IJ SOLN
10.0000 mg | Freq: Once | INTRAMUSCULAR | Status: AC
  Administered 2023-06-24: 10 mg via INTRAMUSCULAR

## 2023-06-24 NOTE — ED Notes (Signed)
 Patient is being discharged from the Urgent Care and sent to the Emergency Department via POV. Per Sheridan Din, PA, patient is in need of higher level of care due to headache that is getting worse after medications. Patient is aware and verbalizes understanding of plan of care.  Vitals:   06/24/23 1433 06/24/23 1557  BP: (!) 144/75 (!) 151/91  Pulse: 70 64  Resp: 16 16  Temp: 98.3 F (36.8 C)   SpO2: 96%

## 2023-06-24 NOTE — Discharge Instructions (Addendum)
 Severe right-sided headache that has not responded to injection of Toradol , Decadron  and Imitrex.  The headache has persisted.  Although there are no other neurological symptoms, given the severity and persistence of the headache we recommend further evaluation at the emergency room where more advanced imaging can be done as well as lab work.

## 2023-06-24 NOTE — ED Triage Notes (Signed)
 Patient presenting with headache onset 2-3 days ago. Denies history of migraines, denies cold symptoms. States had issues with blood pressure a long time ago.  Denies any recent falls or injuries.  Prescriptions or OTC medications tried: Yes- Ibuprofen  500, tylenol     with no relief

## 2023-06-24 NOTE — ED Provider Notes (Signed)
 [facility]    CSN: [id number] Arrival date & time: 06/24/23  1424      History   Chief Complaint Chief Complaint  Patient presents with   Headache    HPI Carlos Meyers is a 38 y.o. male.   38 year old male who presents urgent care with complaints of a headache.  This started 2 to 3 days ago.  The headache is mostly on the right side along the lateral aspect into the posterior aspect.  He had no precipitating event that he knows of.  He has not recently been sick he has not had any head injury.  He denies any fevers, congestion, cough, shortness of breath, visual changes, light sensitivity, nausea, vomiting, abdominal pain, dysuria, facial paralysis, weakness in the upper or lower extremities.  He has been taking over-the-counter medications but they only provide temporary relief.  The headache is constant but it will ease up and then come back.  He has never had headaches like this in the past.  He has not had any changes in medications.  He does have a history of an isolated high blood pressure but has never had to take any kind of blood pressure medicine.  He did take his blood pressure prior to coming at [facility] and it was approximately 145 over 80-ish.     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 No pertinent surgical history.     Home Medications    Prior to Admission medications   Medication Sig Start Date End Date Taking? Authorizing Provider  QUEtiapine  (SEROQUEL ) 100 MG tablet Take 50 mg by  mouth at bedtime. 03/16/23  Yes [provider]    Family History Family History  Problem Relation Age of Onset   Healthy Mother    Healthy Father     Social History Social History   Tobacco Use   Smoking status: Never   Smokeless tobacco: Never  Vaping Use   Vaping status: Never Used  Substance Use Topics   Alcohol use: Not Currently    Alcohol/week: 8.0 standard drinks of alcohol    Types: 8 Standard drinks or equivalent per week   Drug use: No     Allergies   Neomycin   Review of Systems Review of Systems  Constitutional:  Negative for chills and fever.  HENT:  Negative for ear pain and sore throat.   Eyes:  Negative for pain and visual disturbance.  Respiratory:  Negative for cough and shortness of breath.   Cardiovascular:  Negative for chest pain and palpitations.  Gastrointestinal:  Negative for abdominal pain and vomiting.  Genitourinary:  Negative for dysuria and hematuria.  Musculoskeletal:  Negative for arthralgias and back pain.  Skin:  Negative for color change and rash.  Neurological:  Positive for headaches. Negative for seizures and syncope.  All other systems reviewed and are negative.    Physical Exam Triage Vital Signs ED Triage Vitals  Encounter Vitals Group     BP 06/24/23 1433 (!) 144/75  Systolic BP Percentile --      Diastolic BP Percentile --      Pulse Rate 06/24/23 1433 70     Resp 06/24/23 1433 16     Temp 06/24/23 1433 98.3 F (36.8 C)     Temp Source 06/24/23 1433 Oral     SpO2 06/24/23 1433 96 %     Weight --      Height --      Head Circumference --      Peak Flow --      Pain Score 06/24/23 1431 9     Pain Loc --      Pain Education --      Exclude from Growth Chart --    No data found.  Updated Vital Signs BP (!) 151/91 (BP Location: Right Arm)   Pulse 64   Temp 98.3 F (36.8 C) (Oral)   Resp 16   SpO2 96%   Visual Acuity Right Eye Distance:   Left Eye Distance:   Bilateral Distance:     Right Eye Near:   Left Eye Near:    Bilateral Near:     Physical Exam Vitals and nursing note reviewed.  Constitutional:      General: He is not in acute distress.    Appearance: He is well-developed.  HENT:     Head: Normocephalic and atraumatic.  Eyes:     Extraocular Movements: Extraocular movements intact.     Conjunctiva/sclera: Conjunctivae normal.     Pupils: Pupils are equal, round, and reactive to light.  Cardiovascular:     Rate and Rhythm: Normal rate and regular rhythm.     Heart sounds: No murmur heard. Pulmonary:     Effort: Pulmonary effort is normal. No respiratory distress.     Breath sounds: Normal breath sounds.  Abdominal:     Palpations: Abdomen is soft.     Tenderness: There is no abdominal tenderness.  Musculoskeletal:        General: No swelling.     Cervical back: Normal range of motion and neck supple.  Skin:    General: Skin is warm and dry.     Capillary Refill: Capillary refill takes less than 2 seconds.  Neurological:     Mental Status: He is alert and oriented to person, place, and time.     Cranial Nerves: No cranial nerve deficit.     Sensory: No sensory deficit.     Motor: No weakness.     Coordination: Romberg sign negative. Coordination normal.     Gait: Gait normal.     Comments: No temporal artery tenderness.   Psychiatric:        Mood and Affect: Mood normal.        Speech: Speech normal.        Behavior: Behavior normal.      UC Treatments / Results  Labs (all labs ordered are listed, but only abnormal results are displayed) Labs Reviewed - No data to display  EKG   Radiology No results found.  Procedures Procedures (including critical care time)  Medications Ordered in UC Medications  ketorolac  (TORADOL ) 30 MG/ML injection 30 mg (30 mg Intramuscular Given 06/24/23 1511)  dexamethasone  (DECADRON ) injection 10 mg (10 mg Intramuscular Given 06/24/23 1514)  SUMAtriptan (IMITREX) injection 6 mg (6 mg Subcutaneous  Given 06/24/23 1555)    Initial Impression / Assessment and Plan / UC Course  I have reviewed the triage vital signs and the nursing notes.  Pertinent  labs & imaging results that were available during my care of the patient were reviewed by me and considered in my medical decision making (see chart for details).     Acute intractable headache, unspecified headache type   Severe right-sided headache that has not responded to injection of Toradol , Decadron  and Imitrex.  The headache has persisted.  Although there are no other neurological symptoms, given the severity and persistence of the headache we recommend further evaluation at the emergency room where more advanced imaging can be done as well as lab work.  Final Clinical Impressions(s) / UC Diagnoses   Final diagnoses:  Acute intractable headache, unspecified headache type     Discharge Instructions      Severe right-sided headache that has not responded to injection of Toradol , Decadron  and Imitrex.  The headache has persisted.  Although there are no other neurological symptoms, given the severity and persistence of the headache we recommend further evaluation at the emergency room where more advanced imaging can be done as well as lab work.   ED Prescriptions   None    PDMP not reviewed this encounter.   Kreg Pesa, PA-C 06/24/23 1612
